# Patient Record
Sex: Female | Born: 1980 | Race: Black or African American | Hispanic: No | Marital: Single | Smoking: Never smoker
Health system: Southern US, Community
[De-identification: ages and names within clinical notes are randomized; demographics above are authoritative.]

## PROBLEM LIST (undated history)

## (undated) DIAGNOSIS — Z8661 Personal history of infections of the central nervous system: Secondary | ICD-10-CM

## (undated) DIAGNOSIS — N938 Other specified abnormal uterine and vaginal bleeding: Secondary | ICD-10-CM

## (undated) DIAGNOSIS — H9191 Unspecified hearing loss, right ear: Secondary | ICD-10-CM

## (undated) HISTORY — DX: Other specified abnormal uterine and vaginal bleeding: N93.8

---

## 2006-07-29 ENCOUNTER — Emergency Department (HOSPITAL_COMMUNITY): Admission: EM | Admit: 2006-07-29 | Discharge: 2006-07-29 | Payer: Self-pay | Admitting: *Deleted

## 2007-05-13 ENCOUNTER — Inpatient Hospital Stay (HOSPITAL_COMMUNITY): Admission: AD | Admit: 2007-05-13 | Discharge: 2007-05-13 | Payer: Self-pay | Admitting: Obstetrics & Gynecology

## 2007-05-30 ENCOUNTER — Ambulatory Visit: Payer: Self-pay | Admitting: Obstetrics and Gynecology

## 2007-06-07 ENCOUNTER — Encounter (INDEPENDENT_AMBULATORY_CARE_PROVIDER_SITE_OTHER): Payer: Self-pay | Admitting: Gynecology

## 2007-06-07 ENCOUNTER — Ambulatory Visit: Payer: Self-pay | Admitting: Gynecology

## 2007-07-18 ENCOUNTER — Ambulatory Visit: Payer: Self-pay | Admitting: Obstetrics & Gynecology

## 2007-08-01 ENCOUNTER — Ambulatory Visit: Payer: Self-pay | Admitting: Obstetrics and Gynecology

## 2007-11-29 ENCOUNTER — Ambulatory Visit: Payer: Self-pay | Admitting: *Deleted

## 2008-03-20 ENCOUNTER — Ambulatory Visit: Payer: Self-pay | Admitting: Family Medicine

## 2008-03-20 ENCOUNTER — Encounter: Payer: Self-pay | Admitting: Obstetrics and Gynecology

## 2008-09-18 ENCOUNTER — Encounter: Payer: Self-pay | Admitting: Obstetrics and Gynecology

## 2008-09-18 ENCOUNTER — Ambulatory Visit: Payer: Self-pay | Admitting: Obstetrics & Gynecology

## 2008-09-18 ENCOUNTER — Encounter (INDEPENDENT_AMBULATORY_CARE_PROVIDER_SITE_OTHER): Payer: Self-pay | Admitting: Family Medicine

## 2008-12-19 ENCOUNTER — Emergency Department (HOSPITAL_COMMUNITY): Admission: EM | Admit: 2008-12-19 | Discharge: 2008-12-19 | Payer: Self-pay | Admitting: Emergency Medicine

## 2009-03-03 ENCOUNTER — Ambulatory Visit: Payer: Self-pay | Admitting: Obstetrics & Gynecology

## 2009-03-11 ENCOUNTER — Encounter: Admission: RE | Admit: 2009-03-11 | Discharge: 2009-03-11 | Payer: Self-pay | Admitting: Obstetrics & Gynecology

## 2009-07-20 ENCOUNTER — Emergency Department (HOSPITAL_COMMUNITY): Admission: EM | Admit: 2009-07-20 | Discharge: 2009-07-20 | Payer: Self-pay | Admitting: Emergency Medicine

## 2009-09-22 ENCOUNTER — Ambulatory Visit: Payer: Self-pay | Admitting: Obstetrics and Gynecology

## 2009-09-22 ENCOUNTER — Encounter: Payer: Self-pay | Admitting: Family

## 2009-09-22 LAB — CONVERTED CEMR LAB
MCHC: 33.1 g/dL (ref 30.0–36.0)
MCV: 94.8 fL (ref 78.0–100.0)
Platelets: 308 10*3/uL (ref 150–400)
RBC: 4.03 M/uL (ref 3.87–5.11)
RDW: 13.9 % (ref 11.5–15.5)

## 2009-11-29 ENCOUNTER — Emergency Department (HOSPITAL_COMMUNITY): Admission: EM | Admit: 2009-11-29 | Discharge: 2009-11-29 | Payer: Self-pay | Admitting: Family Medicine

## 2010-05-04 ENCOUNTER — Ambulatory Visit: Payer: Self-pay | Admitting: Obstetrics and Gynecology

## 2010-09-06 ENCOUNTER — Emergency Department (HOSPITAL_COMMUNITY): Admission: EM | Admit: 2010-09-06 | Discharge: 2010-09-06 | Payer: Self-pay | Admitting: Emergency Medicine

## 2010-11-23 ENCOUNTER — Ambulatory Visit
Admission: RE | Admit: 2010-11-23 | Discharge: 2010-11-23 | Payer: Self-pay | Source: Home / Self Care | Attending: Obstetrics and Gynecology | Admitting: Obstetrics and Gynecology

## 2010-12-28 NOTE — Progress Notes (Unsigned)
NAMECISSY, GALBREATH NO.:  [id number]  MEDICAL RECORD NO.:  [id number]           PATIENT TYPE:  LOCATION:  [facility]                     FACILITY:  PHYSICIAN:  Argentina Donovan, MD        DATE OF BIRTH:  1981/06/13  DATE OF SERVICE:  11/23/2010                                 CLINIC NOTE  The patient is a 30 year old African American female, nulligravida who at her last visit weighed 100 pounds and now weighs 106, 5 feet 5 inches tall.  She came in because of menstrual irregularity sometime ago, was placed on Sprintec which helped.  However, recently she has had intermittent episodes of bleeding.  In talking to her, she tends to miss pills occasionally.  She was not aware that missing the pill could cause her to bleed, so we talked about that and how to take it.  Also talked about constipation, which has been a real problem with her since she started the pill.  I did not think it was related.  I questioned her about diet.  It sound like, she is on a low fiber diet more than anything else with fast food and we have encouraged her to increase oral supplement of her fiber and to increase her fluid intake.  I think the patient is fine but if she continues having problems, I would suggest that perhaps we choose an alternative method of contraception for this young lady.  IMPRESSION:  Intermenstrual bleeding due to missing pill and constipation.          ______________________________ Argentina Donovan, MD    PR/MEDQ  D:  11/23/2010  T:  11/24/2010  Job:  [id number]

## 2011-01-22 LAB — POCT PREGNANCY, URINE: Preg Test, Ur: NEGATIVE

## 2011-02-15 LAB — POCT PREGNANCY, URINE: Preg Test, Ur: NEGATIVE

## 2011-02-21 LAB — POCT I-STAT, CHEM 8
Calcium, Ion: 1.17 mmol/L (ref 1.12–1.32)
Creatinine, Ser: 1 mg/dL (ref 0.4–1.2)
Glucose, Bld: 104 mg/dL — ABNORMAL HIGH (ref 70–99)
HCT: 38 % (ref 36.0–46.0)
Hemoglobin: 12.9 g/dL (ref 12.0–15.0)
TCO2: 24 mmol/L (ref 0–100)

## 2011-03-21 ENCOUNTER — Emergency Department (HOSPITAL_COMMUNITY)
Admission: EM | Admit: 2011-03-21 | Discharge: 2011-03-21 | Disposition: A | Discharge status: Home / Self Care | Payer: BC Managed Care – PPO | Attending: Emergency Medicine | Admitting: Emergency Medicine

## 2011-03-21 DIAGNOSIS — IMO0001 Reserved for inherently not codable concepts without codable children: Secondary | ICD-10-CM | POA: Insufficient documentation

## 2011-03-21 DIAGNOSIS — I891 Lymphangitis: Secondary | ICD-10-CM | POA: Insufficient documentation

## 2011-03-21 NOTE — Group Therapy Note (Signed)
Stacie [name], Stacie [name]            ACCOUNT NO.:  [id number]   MEDICAL RECORD NO.:  [id number]          PATIENT TYPE:  WOC   LOCATION:  [facility]                   FACILITY:  [facility]   PHYSICIAN:  Tinnie Gens, MD        DATE OF BIRTH:  06-02-1981   DATE OF SERVICE:                                  CLINIC NOTE   REASON FOR VISIT:  Contraception followup.   INTERVAL HISTORY:  This is a 30 year old nulliparous African-American  female who is here for followup after initiation of NuvaRing 3 months  ago.  She is doing well with it and is happy with that method.  She has  the same sexual partner for 1 year and declines any STI testing.  Of  note, she is overdue for a Pap smear which was last done June 10, 2007  showing  LGSIL and HPV.  She had a colposcopy and ECC showing no evidence of  malignancy.  Plan was for a follow up Pap smear in 6 months from that.  She denies  any vaginal irritation or itching.  No dyspareunia.  No problem with  tampons.  States her blood pressure has never been elevated to her  knowledge.  She is concerned with having had an episode on February 15, 2008 where she had severe menstrual-like cramp that lasted about 15  minutes causing her to double over and felt like her vision was  disrupted.  Later that day,  she did have onset of menses and the  episode did not ever recur.  She has no neurologic symptoms except to  say that the side of her face is numb after she has been sleeping on  that side of her face for a while   OBJECTIVE:  VITAL SIGNS:  Temperature 96.7, pulse 98, BP 127/92, a blood  pressure recheck is pending, weight 95.7, height 5 feet 5 inches.  GENERAL:  She is in no distress.  NEUROLOGIC:  Grossly intact with normal gait.  ABDOMEN:  Soft, scaphoid.  GU/PELVIC:  NEFG, Tanner 5, negative BSU.  Vagina well rugated and  physiologic white discharge.  Pap done.  Cervix is nulliparous with no  lesions.  Bimanual:  Uterus anteverted, anteflexed, normal  size and  shape.  Adnexa, no tenderness or masses   ASSESSMENT:  1. Contraceptive management, doing well on NuvaRing.  2. Undernutrition.  3. Isolated borderline blood pressure.  Blood pressure recheck is      120/76.   DISCHARGE INSTRUCTIONS:  She is advised to continue on the NuvaRing as  directed and followup will be pending result of this Pap smear.  She is  also advised that if she has another episode like she had last month,  she  should go to an ER if it does not resolve on its own in a brief amount  of time.  Discussed diet and her plans that she may be moving in the  near future.  If so, we will fax records as needed.     ______________________________  Caren Griffins, CNM    ______________________________  Tinnie Gens, MD    DP/MEDQ  D:  03/20/2008  T:  03/20/2008  Job:  [id number]

## 2011-03-21 NOTE — Group Therapy Note (Signed)
NAMEMADHAVI, HAMBLEN NO.:  [id number]   MEDICAL RECORD NO.:  [id number]          PATIENT TYPE:  WOC   LOCATION:  [facility]                   FACILITY:  [facility]   PHYSICIAN:  Elsie Lincoln, MD      DATE OF BIRTH:  1981/01/27   DATE OF SERVICE:                                  CLINIC NOTE   HISTORY OF PRESENT ILLNESS:  The patient is a 30 year old female who  presents for bleeding almost every day since March 11.  This is the  second episode that this happened to the patient.  It also happened in  2007.  In the past, the patient had been on NuvaRing but stopped  secondary to insurance issues a while back.  The patient began her  menses March 11, it was heavy for three weeks, and then she has been  spotting on and off since ending the three weeks continuously.  The  patient denies being sexually active and her UCG today is negative.  There is no cramping associated with it, and there is also no breast  tenderness associated with her bleeding.  The biggest impression I have  at this point is that the patient has a DMI of 16, and this is probably  very hypoestrogenic and not ovulating and having an ovulatory bleeding  problem, but hypoestrogenic.   PAST MEDICAL HISTORY:  Meningitis and hearing impaired.   PAST SURGICAL HISTORY:  Denies.   SOCIAL HISTORY:  Nonsmoker, nondrinker, no drug use.  Joann had a low-  grade Pap smear in the past and then two negative Pap smears after that,  colpo was unremarkable.  She is not sexually active currently.   MEDICATIONS:  Claritin.   ALLERGIES:  No known drug allergies.  No Latex allergies.   PHYSICAL EXAMINATION:  VITAL SIGNS:  Temperature 97.4, pulse 70, blood  pressure 106/69, weight 96.5, height 5 feet 5 inches, BMI 16.  GENERAL:  Underweight.  No apparent distress.  HEENT:  Normocephalic, atraumatic.  ABDOMEN:  Soft, nontender.  No organomegaly.  No hernia.  Genitalia  Tanner V.  Vaginal pink, normal rugae.  Old dark  blood in the vault.  No  clots.  Cervix closed, nontender.  Uterus very small.  Adnexa:  No  masses, nontender.   ASSESSMENT/PLAN:  A 30 year old female with abnormal uterine bleeding  most likely secondary to her low body weight.  1. Check TSH.  2. Start patient on [place].  The patient has headache on birth      control pills, possibly migraines.  There is no aura.  When the      patient only has four periods a year, hopefully this will decrease      her migraines.  If it becomes a problem, we can send her to      __________[facility].  3. The patient will be sent to nutrition for counseling about how to      gain weight.  I think if she gains some weight      and starts ovulating, her bleeding will not be a problem.  4. Return to clinic in six weeks.  ______________________________  Elsie Lincoln, MD     KL/MEDQ  D:  03/03/2009  T:  03/03/2009  Job:  [id number]

## 2011-03-21 NOTE — Group Therapy Note (Signed)
Stacie [name], MERCED NO.:  [id number]   MEDICAL RECORD NO.:  [id number]          PATIENT TYPE:  WOC   LOCATION:  [facility]                   FACILITY:  [facility]   PHYSICIAN:  Ginger Carne, MD DATE OF BIRTH:  Jan 18, 1981   DATE OF SERVICE:  06/07/2007                                  CLINIC NOTE   This is a 30 year old African American female who presents for her  yearly examination.  Apparently, she was seen in the [facility] on May 13, 2007, complaining of vulvar and vaginal irritation.  Since then, the  patient seems to have less discomfort and the impression was contact  dermatitis.  Otherwise, her menses are regular about every 30 days  lasting 4-5 days.  Is not sexually active and has no desire for birth  control.  This is her first gynecological examination.   PHYSICAL EXAMINATION:  Vital signs per record sheet.  HEENT:  Grossly normal.  BREAST:  Exam without masses, discharge, thickenings or tenderness.  CHEST:  Clear to percussion and auscultation.  CARDIOVASCULAR:  Without murmurs or enlargements.  Regular rate and  rhythm.  EXTREMITIES, LYMPHATICS, SKIN, NEUROLOGICAL, MUSCULOSKELETAL SYSTEMS:  Normal.  ABDOMEN:  Soft without gross hepatosplenomegaly.  PELVIC EXAM:  External genitalia, vulva and vagina reveal some degree of  scaling on the vulva and perianal regions. No other specific findings  noted.  The cervix is smooth without erosions or lesion.  Uterus is  small, inverted and flexed and both adnexa normal.  Pap smear performed.   IMPRESSION:  Normal gynecological exam with resolving dermatitis.  The  patient states she has slight irritation below consisting of purists and  Balmex was recommended to use twice a day until clear.           ______________________________  Ginger Carne, MD     SHB/MEDQ  D:  06/07/2007  T:  06/07/2007  Job:  [id number]

## 2011-03-21 NOTE — Group Therapy Note (Signed)
Stacie [name], Stacie [name] NO.:  [id number]   MEDICAL RECORD NO.:  [id number]          PATIENT TYPE:  WOC   LOCATION:  [facility]                   FACILITY:  [facility]   PHYSICIAN:  Karlton Lemon, MD      DATE OF BIRTH:  05-23-1981   DATE OF SERVICE:                                  CLINIC NOTE   REASON FOR VISIT:  Contraception management.   HISTORY OF PRESENT ILLNESS:  This is a 30 year old gravida 0 that is  presenting for contraceptive management.  She wants to discuss her  options for contraception and initiate birth control today.  She says  she has not been sexually active in the past, but desires to begin  sexual activity with her current partner.  She does state she has a  preference to start a vaginal ring, but would like to hear about the  other options.   PAST MEDICAL HISTORY:  History of LGSIL.   OBSTETRICAL HISTORY:  The patient has never been pregnant.   MEDICATIONS:  None.   ALLERGIES:  NO KNOWN DRUG ALLERGIES.   SOCIAL HISTORY:  Denies alcohol, tobacco or drug use.   PHYSICAL EXAMINATION:  GENERAL:  This is a well-appearing female in no  distress.  VITAL SIGNS:  Temperature 97.0. pulse 97, blood pressure 118/78, weight  100.9 pounds.   ASSESSMENT/PLAN:  This is a 30 year old gravida 0, presenting for  contraceptive management.  I have discussed all options including oral  contraceptive pills, estrogen patches, estrogen ring, Depo-Provera,  Implanon and intrauterine devices.  The patient elects to try the  NuvaRing vaginal ring for contraception.  We will provide her with  prescription for 1 ring per month with refills for 1 year.  We will  follow her in 3-4 months to follow her satisfaction with this type of  contraception.  The patient is instructed that if she does not like the  NuvaRing, she can call and ask for an appointment at a sooner date.           ______________________________  Karlton Lemon, MD     NS/MEDQ  D:  11/29/2007   T:  11/29/2007  Job:  [id number]

## 2011-04-20 ENCOUNTER — Ambulatory Visit: Payer: BC Managed Care – PPO | Admitting: Physician Assistant

## 2011-08-08 LAB — POCT URINALYSIS DIP (DEVICE)
Nitrite: NEGATIVE
Protein, ur: NEGATIVE
Specific Gravity, Urine: 1.02
Urobilinogen, UA: 0.2
pH: 7

## 2011-08-22 LAB — GC/CHLAMYDIA PROBE AMP, GENITAL
Chlamydia, DNA Probe: NEGATIVE
GC Probe Amp, Genital: NEGATIVE

## 2011-08-22 LAB — WET PREP, GENITAL

## 2011-12-11 ENCOUNTER — Emergency Department (INDEPENDENT_AMBULATORY_CARE_PROVIDER_SITE_OTHER): Payer: BC Managed Care – PPO

## 2011-12-11 ENCOUNTER — Emergency Department (INDEPENDENT_AMBULATORY_CARE_PROVIDER_SITE_OTHER)
Admission: EM | Admit: 2011-12-11 | Discharge: 2011-12-11 | Disposition: A | Discharge status: Home / Self Care | Payer: BC Managed Care – PPO | Source: Home / Self Care | Attending: Family Medicine | Admitting: Family Medicine

## 2011-12-11 ENCOUNTER — Encounter (HOSPITAL_COMMUNITY): Payer: Self-pay

## 2011-12-11 DIAGNOSIS — M546 Pain in thoracic spine: Secondary | ICD-10-CM

## 2011-12-11 DIAGNOSIS — H9319 Tinnitus, unspecified ear: Secondary | ICD-10-CM

## 2011-12-11 MED ORDER — DICLOFENAC POTASSIUM 50 MG PO TABS: 50.0000 mg | ORAL_TABLET | Freq: Three times a day (TID) | ORAL | Status: DC

## 2011-12-11 NOTE — ED Notes (Signed)
C/o sharp pain in lt arm, muscle aches in lt upper back and ringing in ears for 1 week.

## 2011-12-11 NOTE — ED Provider Notes (Signed)
History     CSN: [id number]  Arrival date & time 12/11/11  1249   First MD Initiated Contact with Patient 12/11/11 1410      Chief Complaint  Patient presents with  . Arm Pain  . Back Pain  . Tinnitus    (Consider location/radiation/quality/duration/timing/severity/associated sxs/prior treatment) Patient is a 31 y.o. female presenting with arm pain and back pain. The history is provided by the patient.  Arm Pain This is a new problem. The current episode started more than 1 week ago (rash left dorsal forearm , resolved, then pain skipping upper arm and also in left mid thoracic back. ). The problem occurs constantly. The problem has not changed since onset.Pertinent negatives include no chest pain, no abdominal pain and no shortness of breath.  Back Pain  Pertinent negatives include no chest pain and no abdominal pain.    History reviewed. No pertinent past medical history.  History reviewed. No pertinent past surgical history.  No family history on file.  History  Substance Use Topics  . Smoking status: Never Smoker   . Smokeless tobacco: Not on file  . Alcohol Use: Yes    OB History    Grav Para Term Preterm Abortions TAB SAB Ect Mult Living                  Review of Systems  Constitutional: Negative.   HENT: Positive for tinnitus. Negative for congestion, rhinorrhea and postnasal drip.   Eyes: Negative.   Respiratory: Negative.  Negative for shortness of breath.   Cardiovascular: Negative for chest pain.  Gastrointestinal: Negative.  Negative for abdominal pain.  Musculoskeletal: Positive for back pain.  Skin: Negative.   Neurological: Negative.     Allergies  Review of patient's allergies indicates no known allergies.  Home Medications   Current Outpatient Rx  Name Route Sig Dispense Refill  . NORGESTIMATE-ETH ESTRADIOL 0.25-35 MG-MCG PO TABS Oral Take 1 tablet by mouth daily.    Marland Kitchen DICLOFENAC POTASSIUM 50 MG PO TABS Oral Take 1 tablet (50 mg total)  by mouth 3 (three) times daily. 30 tablet 0    BP 105/68  Pulse 89  Temp(Src) 97.9 F (36.6 C) (Oral)  Resp 20  SpO2 99%  LMP 12/04/2011  Physical Exam  Nursing note and vitals reviewed. Constitutional: She appears well-developed and well-nourished.  HENT:  Head: Normocephalic.  Right Ear: External ear normal. No drainage, swelling or tenderness. Tympanic membrane is not scarred and not perforated. No middle ear effusion. Decreased hearing is noted.  Left Ear: External ear normal. No drainage, swelling or tenderness. Tympanic membrane is not scarred and not perforated.  No middle ear effusion. Decreased hearing is noted.  Mouth/Throat: Oropharynx is clear and moist.  Eyes: Pupils are equal, round, and reactive to light.  Neck: Normal range of motion. Neck supple.  Cardiovascular: Normal rate.   Pulmonary/Chest: Effort normal and breath sounds normal.  Musculoskeletal: Normal range of motion. She exhibits no tenderness.       Thoracic back: Normal.       Left forearm: Normal. She exhibits no tenderness, no swelling, no edema and no deformity.  Lymphadenopathy:    She has no cervical adenopathy.    ED Course  Procedures (including critical care time)  Labs Reviewed - No data to display Dg Thoracic Spine 2 View  12/11/2011  *RADIOLOGY REPORT*  Clinical Data: Back pain.  THORACIC SPINE - 2 VIEW  Comparison: None  Findings: The lateral film demonstrates normal alignment  of the thoracic vertebral bodies.  Disc spaces and vertebral bodies are maintained.  No acute bony findings, destructive bony changes or abnormal paraspinal soft tissue swelling.  The visualized posterior ribs appear normal.  IMPRESSION: Normal alignment and no acute bony findings.  Original Report Authenticated By: P. Loralie Champagne, M.D.     1. Back pain, thoracic   2. Tinnitus, subjective       MDM  X-rays reviewed and report per radiologist.         Barkley Bruns, MD 12/11/11 [phone]

## 2011-12-14 ENCOUNTER — Ambulatory Visit: Payer: BC Managed Care – PPO | Admitting: Family Medicine

## 2012-01-09 ENCOUNTER — Other Ambulatory Visit: Payer: Self-pay | Admitting: Otolaryngology

## 2012-01-15 ENCOUNTER — Ambulatory Visit
Admission: RE | Admit: 2012-01-15 | Discharge: 2012-01-15 | Disposition: A | Discharge status: Home / Self Care | Payer: BC Managed Care – PPO | Source: Ambulatory Visit | Attending: Otolaryngology | Admitting: Otolaryngology

## 2012-01-15 MED ORDER — GADOBENATE DIMEGLUMINE 529 MG/ML IV SOLN
11.0000 mL | Freq: Once | INTRAVENOUS | Status: AC | PRN
  Administered 2012-01-15: 11 mL via INTRAVENOUS

## 2012-01-17 ENCOUNTER — Ambulatory Visit: Payer: BC Managed Care – PPO | Admitting: Family Medicine

## 2012-02-07 ENCOUNTER — Encounter: Payer: Self-pay | Admitting: Physician Assistant

## 2012-02-07 ENCOUNTER — Ambulatory Visit (INDEPENDENT_AMBULATORY_CARE_PROVIDER_SITE_OTHER): Payer: BC Managed Care – PPO | Admitting: Physician Assistant

## 2012-02-07 VITALS — BP 102/68 | HR 72 | Temp 97.8°F | Resp 16 | Ht 65.0 in | Wt 94.7 lb

## 2012-02-07 DIAGNOSIS — N912 Amenorrhea, unspecified: Secondary | ICD-10-CM

## 2012-02-07 DIAGNOSIS — N938 Other specified abnormal uterine and vaginal bleeding: Secondary | ICD-10-CM

## 2012-02-07 DIAGNOSIS — Z113 Encounter for screening for infections with a predominantly sexual mode of transmission: Secondary | ICD-10-CM

## 2012-02-07 HISTORY — DX: Other specified abnormal uterine and vaginal bleeding: N93.8

## 2012-02-07 LAB — POCT PREGNANCY, URINE: Preg Test, Ur: NEGATIVE

## 2012-02-07 NOTE — Progress Notes (Signed)
Chief Complaint:  Menorrhagia    None     Stacie [name] is  31 y.o. G0P0.  Patient's last menstrual period was 12/31/2011.Marland Kitchen  Her pregnancy status is negative.  She presents complaining of Menorrhagia  Pt states she has had irregular bleeding x 2-3 months since stopping OCPs. Reports that pharmacist at [facility] instructed her to stop while on ABX for an ear infection. Pt states that she was originally put of OCPs   Past Medical History: Past Medical History  Diagnosis Date  . DUB (dysfunctional uterine bleeding) 02/07/2012    Past Surgical History: History reviewed. No pertinent past surgical history.  Family History: Family History  Problem Relation Age of Onset  . Ovarian cysts Mother   . Ovarian cysts Sister     Social History: History  Substance Use Topics  . Smoking status: Never Smoker   . Smokeless tobacco: Not on file  . Alcohol Use: Yes     occasionally    Allergies: No Known Allergies   (Not in a hospital admission)  Review of Systems - Negative except what is in HPI  Physical Exam   Blood pressure 102/68, pulse 72, temperature 97.8 F (36.6 C), temperature source Oral, resp. rate 16, height 5\' 5"  (1.651 m), weight 94 lb 11.2 oz (42.956 kg), last menstrual period 12/31/2011. General: Alert, well appearing, and in no distress Focused Gynecological Exam: normal external genitalia, vulva, vagina, cervix, uterus and adnexa. Cultures obtained.  Assessment: Patient Active Problem List  Diagnoses  . DUB (dysfunctional uterine bleeding)    Plan: Bleeding due to stopping OCPs STI testing done as per patient request She will restart OCPs.  Stacie [name] E. 02/07/2012,2:26 PM

## 2012-02-07 NOTE — Progress Notes (Signed)
Pt has not taken OCP's for 1-2 months- she states she was told by the [facility] pharmacist that she could not take OCP's while on antibiotics. She had been given antibiotic by Dr. Joneen Roach for ear fluid imbalance.

## 2012-02-07 NOTE — Patient Instructions (Signed)

## 2012-02-08 LAB — WET PREP, GENITAL
Trich, Wet Prep: NONE SEEN
Yeast Wet Prep HPF POC: NONE SEEN

## 2012-07-22 ENCOUNTER — Ambulatory Visit: Payer: BC Managed Care – PPO | Admitting: Obstetrics & Gynecology

## 2012-08-19 ENCOUNTER — Ambulatory Visit: Payer: BC Managed Care – PPO | Admitting: Obstetrics & Gynecology

## 2012-09-20 ENCOUNTER — Emergency Department (HOSPITAL_COMMUNITY)
Admission: EM | Admit: 2012-09-20 | Discharge: 2012-09-20 | Disposition: A | Discharge status: Home / Self Care | Payer: BC Managed Care – PPO | Attending: Emergency Medicine | Admitting: Emergency Medicine

## 2012-09-20 ENCOUNTER — Encounter (HOSPITAL_COMMUNITY): Payer: Self-pay | Admitting: *Deleted

## 2012-09-20 DIAGNOSIS — M549 Dorsalgia, unspecified: Secondary | ICD-10-CM | POA: Insufficient documentation

## 2012-09-20 DIAGNOSIS — S39012A Strain of muscle, fascia and tendon of lower back, initial encounter: Secondary | ICD-10-CM

## 2012-09-20 DIAGNOSIS — Y939 Activity, unspecified: Secondary | ICD-10-CM | POA: Insufficient documentation

## 2012-09-20 DIAGNOSIS — Z8742 Personal history of other diseases of the female genital tract: Secondary | ICD-10-CM | POA: Insufficient documentation

## 2012-09-20 DIAGNOSIS — Y92410 Unspecified street and highway as the place of occurrence of the external cause: Secondary | ICD-10-CM | POA: Insufficient documentation

## 2012-09-20 DIAGNOSIS — S335XXA Sprain of ligaments of lumbar spine, initial encounter: Secondary | ICD-10-CM | POA: Insufficient documentation

## 2012-09-20 MED ORDER — CYCLOBENZAPRINE HCL 10 MG PO TABS: 10.0000 mg | ORAL_TABLET | Freq: Two times a day (BID) | ORAL | Status: AC | PRN

## 2012-09-20 MED ORDER — IBUPROFEN 600 MG PO TABS: 600.0000 mg | ORAL_TABLET | Freq: Four times a day (QID) | ORAL | Status: AC | PRN
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
## 2012-09-20 NOTE — ED Notes (Signed)
C-collar removed by EDPA.  

## 2012-09-23 ENCOUNTER — Ambulatory Visit (INDEPENDENT_AMBULATORY_CARE_PROVIDER_SITE_OTHER): Payer: BC Managed Care – PPO | Admitting: Obstetrics & Gynecology

## 2012-09-23 ENCOUNTER — Encounter: Payer: Self-pay | Admitting: Obstetrics & Gynecology

## 2012-09-23 VITALS — BP 119/77 | HR 90 | Temp 97.5°F | Ht 68.0 in | Wt 101.2 lb

## 2012-09-23 DIAGNOSIS — Z01419 Encounter for gynecological examination (general) (routine) without abnormal findings: Secondary | ICD-10-CM

## 2012-09-23 DIAGNOSIS — Z0000 Encounter for general adult medical examination without abnormal findings: Secondary | ICD-10-CM

## 2012-09-23 MED ORDER — NORETHIN-ETH ESTRAD BIPHASIC 0.5-35/1-35 MG-MCG PO TABS: 1.0000 | ORAL_TABLET | Freq: Every day | ORAL | Status: DC

## 2012-09-23 NOTE — Progress Notes (Signed)
Subjective:    Stacie [name] is a 31 y.o. female who presents for an annual exam. The patient has no complaints today. She would like to restart OCPs although her periods have been much lighter. The patient is sexually active. GYN screening history: last pap: was normal. The patient wears seatbelts: yes. The patient participates in regular exercise: yes. Has the patient ever been transfused or tattooed?: not asked. The patient reports that there is not domestic violence in her life.   Menstrual History: OB History    Grav Para Term Preterm Abortions TAB SAB Ect Mult Living   0               Menarche age: 23 Patient's last menstrual period was 07/21/2012.    The following portions of the patient's history were reviewed and updated as appropriate: allergies, current medications, past family history, past medical history, past social history, past surgical history and problem list.  Review of Systems A comprehensive review of systems was negative.    Objective:    BP 119/77  Pulse 90  Temp 97.5 F (36.4 C) (Oral)  Ht 5\' 8"  (1.727 m)  Wt 45.904 kg (101 lb 3.2 oz)  BMI 15.39 kg/m2  LMP 07/21/2012  General Appearance:    Alert, cooperative, no distress, appears stated age  Head:    Normocephalic, without obvious abnormality, atraumatic  Eyes:    PERRL, conjunctiva/corneas clear, EOM's intact, fundi    benign, both eyes  Ears:    Normal TM's and external ear canals, both ears  Nose:   Nares normal, septum midline, mucosa normal, no drainage    or sinus tenderness  Throat:   Lips, mucosa, and tongue normal; teeth and gums normal  Neck:   Supple, symmetrical, trachea midline, no adenopathy;    thyroid:  no enlargement/tenderness/nodules; no carotid   bruit or JVD  Back:     Symmetric, no curvature, ROM normal, no CVA tenderness  Lungs:     Clear to auscultation bilaterally, respirations unlabored  Chest Wall:    No tenderness or deformity   Heart:    Regular rate and rhythm, S1  and S2 normal, no murmur, rub   or gallop  Breast Exam:    No tenderness, masses, or nipple abnormality  Abdomen:     Soft, non-tender, bowel sounds active all four quadrants,    no masses, no organomegaly  Genitalia:    Normal female without lesion, discharge or tenderness, NSSA, NT, mobile, no adnexal masses or tenderness     Extremities:   Extremities normal, atraumatic, no cyanosis or edema  Pulses:   2+ and symmetric all extremities  Skin:   Skin color, texture, turgor normal, no rashes or lesions  Lymph nodes:   Cervical, supraclavicular, and axillary nodes normal  Neurologic:   CNII-XII intact, normal strength, sensation and reflexes    throughout  .    Assessment:    Healthy female exam.    Plan:     Thin prep Pap smear.  Restart OCPs BP check in a month She declines a flu vaccine

## 2012-09-24 NOTE — ED Provider Notes (Signed)
Medical screening examination/treatment/procedure(s) were performed by non-physician practitioner and as supervising physician I was immediately available for consultation/collaboration.   Dione Booze, MD 09/24/12 [phone]

## 2012-12-09 ENCOUNTER — Encounter: Payer: Self-pay | Admitting: Physician Assistant

## 2013-04-29 ENCOUNTER — Emergency Department (INDEPENDENT_AMBULATORY_CARE_PROVIDER_SITE_OTHER)
Admission: EM | Admit: 2013-04-29 | Discharge: 2013-04-29 | Disposition: A | Discharge status: Home / Self Care | Payer: BC Managed Care – PPO | Source: Home / Self Care

## 2013-04-29 ENCOUNTER — Encounter (HOSPITAL_COMMUNITY): Payer: Self-pay | Admitting: Emergency Medicine

## 2013-04-29 DIAGNOSIS — H9312 Tinnitus, left ear: Secondary | ICD-10-CM

## 2013-04-29 DIAGNOSIS — H9319 Tinnitus, unspecified ear: Secondary | ICD-10-CM

## 2013-04-29 DIAGNOSIS — J302 Other seasonal allergic rhinitis: Secondary | ICD-10-CM

## 2013-04-29 DIAGNOSIS — J309 Allergic rhinitis, unspecified: Secondary | ICD-10-CM

## 2013-04-29 HISTORY — DX: Unspecified hearing loss, right ear: H91.91

## 2013-04-29 MED ORDER — MECLIZINE HCL 25 MG PO TABS: ORAL_TABLET | ORAL | Status: AC

## 2013-04-29 MED ORDER — CHLORPHENIRAMINE-PSE-IBUPROFEN 2-30-200 MG PO TABS: ORAL_TABLET | ORAL | Status: AC

## 2013-04-29 MED ORDER — METHYLPREDNISOLONE 4 MG PO KIT: PACK | ORAL | Status: AC

## 2013-04-29 MED ORDER — MOMETASONE FUROATE 50 MCG/ACT NA SUSP
NASAL | Status: AC
Start: 2013-04-29 — End: ?

## 2013-04-29 NOTE — ED Notes (Signed)
Pt c/o left ear pain x 1 month. Feels like it could be allergy related. Took some allergy medicine with no relief. Patient is alert and oriented.

## 2013-04-29 NOTE — ED Provider Notes (Signed)
History    CSN: [id number] Arrival date & time 04/29/13  1357  None    Chief Complaint  Patient presents with  . Otalgia   (Consider location/radiation/quality/duration/timing/severity/associated sxs/prior Treatment) HPI Comments: 32 year old female presents complaining of tinnitus intermittently for 3 weeks, becoming constant for the past week in the left ear only and also some intermittent mild vertigo-type symptoms. She normally has some tinnitus with her usual seasonal allergies but has never been constant as it has been for the past week. The vertigo is brief and is associated with change in head position. She denies fever, chills, headache, blurry vision.  Patient is a 32 y.o. female presenting with ear pain.  Otalgia Associated symptoms: congestion, hearing loss (deaf in right ear, decreased hearing in left ear), rhinorrhea and tinnitus   Associated symptoms: no abdominal pain, no cough, no ear discharge, no fever, no neck pain, no rash, no sore throat and no vomiting    Past Medical History  Diagnosis Date  . DUB (dysfunctional uterine bleeding) 02/07/2012  . Deafness in right ear    History reviewed. No pertinent past surgical history. Family History  Problem Relation Age of Onset  . Ovarian cysts Mother   . Ovarian cysts Sister    History  Substance Use Topics  . Smoking status: Never Smoker   . Smokeless tobacco: Not on file  . Alcohol Use: Yes     Comment: occasionally   OB History   Grav Para Term Preterm Abortions TAB SAB Ect Mult Living   0              Review of Systems  Constitutional: Negative for fever and chills.  HENT: Positive for hearing loss (deaf in right ear, decreased hearing in left ear), congestion, rhinorrhea, sneezing, postnasal drip and tinnitus. Negative for ear pain, sore throat, trouble swallowing, neck pain, sinus pressure and ear discharge.   Eyes: Negative for visual disturbance.  Respiratory: Negative for cough and shortness of  breath.   Cardiovascular: Negative for chest pain, palpitations and leg swelling.  Gastrointestinal: Negative for nausea, vomiting and abdominal pain.  Endocrine: Negative for polydipsia and polyuria.  Genitourinary: Negative for dysuria, urgency and frequency.  Musculoskeletal: Negative for myalgias and arthralgias.  Skin: Negative for rash.  Neurological: Positive for dizziness (vertigo). Negative for weakness and light-headedness.    Allergies  Review of patient's allergies indicates no known allergies.  Home Medications   Current Outpatient Rx  Name  Route  Sig  Dispense  Refill  . Chlorpheniramine-PSE-Ibuprofen (ADVIL ALLERGY SINUS) 2-30-200 MG TABS      2 tabs by mouth every 6 hours when necessary   84 each   0   . cyclobenzaprine (FLEXERIL) 10 MG tablet   Oral   Take 1 tablet (10 mg total) by mouth 2 (two) times daily as needed for muscle spasms.   20 tablet   0   . ibuprofen (ADVIL,MOTRIN) 600 MG tablet   Oral   Take 1 tablet (600 mg total) by mouth every 6 (six) hours as needed for pain.   30 tablet   0   . meclizine (ANTIVERT) 25 MG tablet      1 tablet by mouth 4 times a day when necessary for vertigo   28 tablet   0   . methylPREDNISolone (MEDROL DOSEPAK) 4 MG tablet      Use taper dose pack as directed   21 tablet   0   . mometasone (NASONEX) 50 MCG/ACT nasal  spray      2 sprays per nostril twice a day   17 g   12   . Norethindrone-Ethinyl Estradiol Biphasic (NECON 10/11, 28,) 0.5-35/1-35 MG-MCG tablet   Oral   Take 1 tablet by mouth daily.   1 Package   11    BP 116/74  Pulse 69  Temp(Src) 97.6 F (36.4 C) (Oral)  Resp 16  SpO2 99%  LMP 03/12/2013 Physical Exam  Nursing note and vitals reviewed. Constitutional: She is oriented to person, place, and time. Vital signs are normal. She appears well-developed and well-nourished. No distress.  HENT:  Head: Atraumatic.  Right Ear: Tympanic membrane and ear canal normal.  Left Ear:  Tympanic membrane and ear canal normal.  Mouth/Throat: Uvula is midline, oropharynx is clear and moist and mucous membranes are normal. No oropharyngeal exudate, posterior oropharyngeal edema or posterior oropharyngeal erythema.  Right ear, hearing aid in the left ear  Eyes: EOM are normal. Pupils are equal, round, and reactive to light.  Cardiovascular: Normal rate, regular rhythm and normal heart sounds.  Exam reveals no gallop and no friction rub.   No murmur heard. Pulmonary/Chest: Effort normal and breath sounds normal. No respiratory distress. She has no wheezes. She has no rales.  Abdominal: Soft. There is no tenderness.  Neurological: She is alert and oriented to person, place, and time. She has normal strength.  Skin: Skin is warm and dry. She is not diaphoretic.  Psychiatric: She has a normal mood and affect. Her behavior is normal. Judgment normal.    ED Course  Procedures (including critical care time) Labs Reviewed - No data to display No results found. 1. Tinnitus, left   2. Seasonal allergies     MDM  The physical exam is normal today. We'll treat symptomatically for seasonal allergies and resulting vertigo and she will followup if she does not improve.    Meds ordered this encounter  Medications  . methylPREDNISolone (MEDROL DOSEPAK) 4 MG tablet    Sig: Use taper dose pack as directed    Dispense:  21 tablet    Refill:  0  . mometasone (NASONEX) 50 MCG/ACT nasal spray    Sig: 2 sprays per nostril twice a day    Dispense:  17 g    Refill:  12  . meclizine (ANTIVERT) 25 MG tablet    Sig: 1 tablet by mouth 4 times a day when necessary for vertigo    Dispense:  28 tablet    Refill:  0  . Chlorpheniramine-PSE-Ibuprofen (ADVIL ALLERGY SINUS) 2-30-200 MG TABS    Sig: 2 tabs by mouth every 6 hours when necessary    Dispense:  84 each    Refill:  0     Graylon Good, PA-C 04/29/13 2121

## 2013-05-13 NOTE — ED Provider Notes (Signed)
Medical screening examination/treatment/procedure(s) were performed by resident physician or non-physician practitioner and as supervising physician I was immediately available for consultation/collaboration.   Amariss Detamore DOUGLAS MD.   Romeka Scifres D Shad Ledvina, MD 05/13/13 0852 

## 2013-05-27 ENCOUNTER — Encounter (HOSPITAL_COMMUNITY): Payer: Self-pay | Admitting: Emergency Medicine

## 2013-05-27 ENCOUNTER — Emergency Department (HOSPITAL_COMMUNITY)
Admission: EM | Admit: 2013-05-27 | Discharge: 2013-05-27 | Disposition: A | Discharge status: Home / Self Care | Payer: BC Managed Care – PPO | Source: Home / Self Care | Attending: Family Medicine | Admitting: Family Medicine

## 2013-05-27 DIAGNOSIS — J309 Allergic rhinitis, unspecified: Secondary | ICD-10-CM

## 2013-05-27 DIAGNOSIS — H9319 Tinnitus, unspecified ear: Secondary | ICD-10-CM

## 2013-05-27 DIAGNOSIS — J310 Chronic rhinitis: Secondary | ICD-10-CM

## 2013-05-27 DIAGNOSIS — R5383 Other fatigue: Secondary | ICD-10-CM

## 2013-05-27 HISTORY — DX: Personal history of infections of the central nervous system: Z86.61

## 2013-05-27 LAB — COMPREHENSIVE METABOLIC PANEL
AST: 18 U/L (ref 0–37)
Albumin: 3.6 g/dL (ref 3.5–5.2)
Calcium: 9.3 mg/dL (ref 8.4–10.5)
Chloride: 105 mEq/L (ref 96–112)
Creatinine, Ser: 0.72 mg/dL (ref 0.50–1.10)
Total Protein: 7.5 g/dL (ref 6.0–8.3)

## 2013-05-27 LAB — CBC WITH DIFFERENTIAL/PLATELET
Basophils Absolute: 0 10*3/uL (ref 0.0–0.1)
Basophils Relative: 0 % (ref 0–1)
Eosinophils Absolute: 0.1 10*3/uL (ref 0.0–0.7)
Eosinophils Relative: 2 % (ref 0–5)
HCT: 39.9 % (ref 36.0–46.0)
MCH: 31.6 pg (ref 26.0–34.0)
MCHC: 33.3 g/dL (ref 30.0–36.0)
Monocytes Absolute: 0.4 10*3/uL (ref 0.1–1.0)
Neutro Abs: 4.2 10*3/uL (ref 1.7–7.7)
RDW: 12.8 % (ref 11.5–15.5)

## 2013-05-27 MED ORDER — FLUTICASONE PROPIONATE 50 MCG/ACT NA SUSP: 2.0000 | Freq: Every day | NASAL | Status: AC

## 2013-05-27 MED ORDER — PREDNISONE 10 MG PO KIT: PACK | ORAL | Status: AC

## 2013-05-27 NOTE — ED Provider Notes (Signed)
History    CSN: [id number] Arrival date & time 05/27/13  1716  First MD Initiated Contact with Patient 05/27/13 1823     Chief Complaint  Patient presents with  . Fatigue   (Consider location/radiation/quality/duration/timing/severity/associated sxs/prior Treatment) HPI Comments: 32 year old female presents complaining of headaches, fatigue, intermittent dizziness, decreased appetite, and ringing in her ears. She was seen here on June 24, about one month ago, for similar symptoms. At that time, she was prescribed prednisone, Nasonex, and Advil allergy sinus. She fill the prednisone but did not take anything else because she says it was too expensive. She did get significantly better with the prednisone but over the last week she has started to feel sick again. She notes that she has a large amount of black mold growing in her house to do a long history of leaky pipes and she feels that this may be contributing to her feeling ill. She states that all of her symptoms are mostly mild to moderate and annoying. She has no specific areas of pain, cough, shortness of breath, rash, abdominal pain, nausea, vomiting, diarrhea. There are no exacerbating or alleviating factors with her  symptoms. None of her symptoms happen at the same time   Past Medical History  Diagnosis Date  . DUB (dysfunctional uterine bleeding) 02/07/2012  . Deafness in right ear   . Hx of meningitis    History reviewed. No pertinent past surgical history. Family History  Problem Relation Age of Onset  . Ovarian cysts Mother   . Ovarian cysts Sister    History  Substance Use Topics  . Smoking status: Never Smoker   . Smokeless tobacco: Not on file  . Alcohol Use: Yes     Comment: occasionally   OB History   Grav Para Term Preterm Abortions TAB SAB Ect Mult Living   0              Review of Systems  Constitutional: Positive for appetite change and fatigue. Negative for fever, chills and unexpected weight change.   HENT: Positive for hearing loss (deaf in right ear, decreased hearing in left ear), congestion, rhinorrhea, sneezing, postnasal drip and tinnitus. Negative for ear pain, sore throat, trouble swallowing, neck pain, sinus pressure and ear discharge.   Eyes: Negative for visual disturbance.  Respiratory: Negative for cough and shortness of breath.   Cardiovascular: Negative for chest pain, palpitations and leg swelling.  Gastrointestinal: Negative for nausea, vomiting and abdominal pain.  Endocrine: Negative for polydipsia and polyuria.  Genitourinary: Negative for dysuria, urgency and frequency.  Musculoskeletal: Negative for myalgias and arthralgias.  Skin: Negative for rash.  Neurological: Positive for dizziness. Negative for weakness and light-headedness.    Allergies  Review of patient's allergies indicates no known allergies.  Home Medications   Current Outpatient Rx  Name  Route  Sig  Dispense  Refill  . Chlorpheniramine-PSE-Ibuprofen (ADVIL ALLERGY SINUS) 2-30-200 MG TABS      2 tabs by mouth every 6 hours when necessary   84 each   0   . cyclobenzaprine (FLEXERIL) 10 MG tablet   Oral   Take 1 tablet (10 mg total) by mouth 2 (two) times daily as needed for muscle spasms.   20 tablet   0   . fluticasone (FLONASE) 50 MCG/ACT nasal spray   Nasal   Place 2 sprays into the nose daily.   16 g   2   . ibuprofen (ADVIL,MOTRIN) 600 MG tablet   Oral   Take  1 tablet (600 mg total) by mouth every 6 (six) hours as needed for pain.   30 tablet   0   . meclizine (ANTIVERT) 25 MG tablet      1 tablet by mouth 4 times a day when necessary for vertigo   28 tablet   0   . methylPREDNISolone (MEDROL DOSEPAK) 4 MG tablet      Use taper dose pack as directed   21 tablet   0   . mometasone (NASONEX) 50 MCG/ACT nasal spray      2 sprays per nostril twice a day   17 g   12   . Norethindrone-Ethinyl Estradiol Biphasic (NECON 10/11, 28,) 0.5-35/1-35 MG-MCG tablet   Oral    Take 1 tablet by mouth daily.   1 Package   11   . PredniSONE 10 MG KIT      12 day taper dose pack, use as directed   48 each   0    BP 122/75  Pulse 84  Temp(Src) 98.6 F (37 C) (Oral)  Resp 14  SpO2 100%  LMP 03/12/2013 Physical Exam  Nursing note and vitals reviewed. Constitutional: She is oriented to person, place, and time. Vital signs are normal. She appears well-developed and well-nourished. No distress.  HENT:  Head: Normocephalic and atraumatic.  Right Ear: External ear normal.  Left Ear: External ear normal.  Nose: Nose normal.  Mouth/Throat: Oropharynx is clear and moist. No oropharyngeal exudate.  Eyes: Conjunctivae and EOM are normal. Pupils are equal, round, and reactive to light. Right eye exhibits no discharge. Left eye exhibits no discharge. No scleral icterus.  Neck: Normal range of motion. Neck supple. No tracheal deviation present. No thyromegaly present.  Cardiovascular: Normal rate, regular rhythm and normal heart sounds.  Exam reveals no gallop and no friction rub.   No murmur heard. Pulmonary/Chest: Effort normal and breath sounds normal. No respiratory distress. She has no wheezes. She has no rales.  Abdominal: Soft. Bowel sounds are normal. She exhibits no distension and no mass. There is no tenderness.  Musculoskeletal: Normal range of motion. She exhibits no edema and no tenderness.  Lymphadenopathy:    She has no cervical adenopathy.  Neurological: She is alert and oriented to person, place, and time. She has normal strength. No cranial nerve deficit.  Skin: Skin is warm and dry. She is not diaphoretic.  Psychiatric: She has a normal mood and affect. Her behavior is normal. Judgment normal.    ED Course  Procedures (including critical care time) Labs Reviewed  CBC WITH DIFFERENTIAL  COMPREHENSIVE METABOLIC PANEL   No results found. 1. Fatigue   2. Rhinitis     MDM  Comprehensive physical exam is again normal today. CBC and CMP were  sent which came back normal as well. We'll try treating this patient with prednisone and we'll prescribe Flonase because Nasonex was too expensive. This may in fact be related to the mold in her home, she has been advised to ask her landlord to have him checked from old and treated if necessary.  F/u with PCP if no improvement    Meds ordered this encounter  Medications  . PredniSONE 10 MG KIT    Sig: 12 day taper dose pack, use as directed    Dispense:  48 each    Refill:  0  . fluticasone (FLONASE) 50 MCG/ACT nasal spray    Sig: Place 2 sprays into the nose daily.    Dispense:  16 g  Refill:  2     Graylon Good, PA-C 05/27/13 2103

## 2013-05-27 NOTE — ED Notes (Signed)
Waiting for bloodwork

## 2013-05-27 NOTE — ED Notes (Signed)
Pt left w/o signing d/c instructions.

## 2013-05-27 NOTE — ED Notes (Addendum)
Pt c/o feeling fatigue onset 3 weeks... Reports she was seen on 6/24 for similar sxs... sxs are coming back but are worse... sxs include: ringing of both ears, dizziness, weakness, decreased appetite; though she is drinking fluids... Denies fevers... Believes it maybe due to mold in her apartment... Ambulated well to exam room... She is alert w/no signs of acute distress.

## 2013-05-28 NOTE — ED Provider Notes (Signed)
Medical screening examination/treatment/procedure(s) were performed by resident physician or non-physician practitioner and as supervising physician I was immediately available for consultation/collaboration.   Barkley Bruns MD.   Linna Hoff, MD 05/28/13 2029

## 2013-06-09 NOTE — ED Notes (Signed)
Chart review; asked if she should come in for recheck, as she is no better, and has not finished her prednisone. Advised to d/c the remaining prednisone ( has 3 pills left )

## 2013-06-09 NOTE — ED Notes (Signed)
States she thinks the prednisone is making her feel wierd

## 2013-06-09 NOTE — ED Notes (Signed)
Chart review.

## 2013-09-25 ENCOUNTER — Emergency Department (HOSPITAL_COMMUNITY)
Admission: EM | Admit: 2013-09-25 | Discharge: 2013-09-25 | Disposition: A | Discharge status: Home / Self Care | Payer: BC Managed Care – PPO | Source: Home / Self Care | Attending: Family Medicine | Admitting: Family Medicine

## 2013-09-25 ENCOUNTER — Encounter (HOSPITAL_COMMUNITY): Payer: Self-pay | Admitting: Emergency Medicine

## 2013-09-25 DIAGNOSIS — G9332 Myalgic encephalomyelitis/chronic fatigue syndrome: Secondary | ICD-10-CM

## 2013-09-25 DIAGNOSIS — R5382 Chronic fatigue, unspecified: Secondary | ICD-10-CM

## 2013-09-25 LAB — POCT I-STAT, CHEM 8
BUN: 18 mg/dL (ref 6–23)
Chloride: 104 mEq/L (ref 96–112)
Creatinine, Ser: 0.9 mg/dL (ref 0.50–1.10)
Glucose, Bld: 74 mg/dL (ref 70–99)
Potassium: 3.6 mEq/L (ref 3.5–5.1)
Sodium: 143 mEq/L (ref 135–145)

## 2013-09-25 LAB — POCT URINALYSIS DIP (DEVICE)
Bilirubin Urine: NEGATIVE
Ketones, ur: NEGATIVE mg/dL
Protein, ur: NEGATIVE mg/dL
Specific Gravity, Urine: 1.015 (ref 1.005–1.030)
pH: 7.5 (ref 5.0–8.0)

## 2013-09-25 NOTE — ED Provider Notes (Signed)
CSN: [id number]     Arrival date & time 09/25/13  1700 History   First MD Initiated Contact with Patient 09/25/13 1732     Chief Complaint  Patient presents with  . Fatigue   (Consider location/radiation/quality/duration/timing/severity/associated sxs/prior Treatment) Patient is a 32 y.o. female presenting with weakness. The history is provided by the patient.  Weakness This is a new problem. Episode onset: a few mos. The problem has not changed since onset.Pertinent negatives include no chest pain, no abdominal pain and no headaches.    Past Medical History  Diagnosis Date  . DUB (dysfunctional uterine bleeding) 02/07/2012  . Deafness in right ear   . Hx of meningitis    History reviewed. No pertinent past surgical history. Family History  Problem Relation Age of Onset  . Ovarian cysts Mother   . Ovarian cysts Sister    History  Substance Use Topics  . Smoking status: Never Smoker   . Smokeless tobacco: Not on file  . Alcohol Use: Yes     Comment: occasionally   OB History   Grav Para Term Preterm Abortions TAB SAB Ect Mult Living   0              Review of Systems  Constitutional: Positive for fatigue. Negative for fever, activity change and appetite change.  HENT: Negative.   Respiratory: Negative.   Cardiovascular: Negative for chest pain.  Gastrointestinal: Negative.  Negative for abdominal pain.  Genitourinary: Positive for menstrual problem.  Neurological: Positive for weakness. Negative for headaches.    Allergies  Review of patient's allergies indicates no known allergies.  Home Medications   Current Outpatient Rx  Name  Route  Sig  Dispense  Refill  . Chlorpheniramine-PSE-Ibuprofen (ADVIL ALLERGY SINUS) 2-30-200 MG TABS      2 tabs by mouth every 6 hours when necessary   84 each   0   . cyclobenzaprine (FLEXERIL) 10 MG tablet   Oral   Take 1 tablet (10 mg total) by mouth 2 (two) times daily as needed for muscle spasms.   20 tablet   0   .  fluticasone (FLONASE) 50 MCG/ACT nasal spray   Nasal   Place 2 sprays into the nose daily.   16 g   2   . ibuprofen (ADVIL,MOTRIN) 600 MG tablet   Oral   Take 1 tablet (600 mg total) by mouth every 6 (six) hours as needed for pain.   30 tablet   0   . meclizine (ANTIVERT) 25 MG tablet      1 tablet by mouth 4 times a day when necessary for vertigo   28 tablet   0   . methylPREDNISolone (MEDROL DOSEPAK) 4 MG tablet      Use taper dose pack as directed   21 tablet   0   . mometasone (NASONEX) 50 MCG/ACT nasal spray      2 sprays per nostril twice a day   17 g   12   . Norethindrone-Ethinyl Estradiol Biphasic (NECON 10/11, 28,) 0.5-35/1-35 MG-MCG tablet   Oral   Take 1 tablet by mouth daily.   1 Package   11   . PredniSONE 10 MG KIT      12 day taper dose pack, use as directed   48 each   0    BP 108/74  Pulse 80  Temp(Src) 98.4 F (36.9 C) (Oral)  Resp 16  SpO2 97%  LMP 09/07/2013 Physical Exam  Nursing note and  vitals reviewed. Constitutional: She is oriented to person, place, and time. She appears well-developed and well-nourished.  HENT:  Head: Normocephalic.  Right Ear: External ear normal.  Left Ear: External ear normal.  Mouth/Throat: Oropharynx is clear and moist.  Eyes: Conjunctivae are normal. Pupils are equal, round, and reactive to light.  Neck: Normal range of motion. Neck supple.  Cardiovascular: Regular rhythm and normal heart sounds.   Pulmonary/Chest: Breath sounds normal.  Abdominal: There is no tenderness.  Lymphadenopathy:    She has no cervical adenopathy.  Neurological: She is alert and oriented to person, place, and time.  Skin: Skin is warm and dry.    ED Course  Procedures (including critical care time) Labs Review Labs Reviewed  POCT I-STAT, CHEM 8 - Abnormal; Notable for the following:    Calcium, Ion 1.28 (*)    All other components within normal limits  POCT URINALYSIS DIP (DEVICE) - Abnormal; Notable for the  following:    Hgb urine dipstick TRACE (*)    All other components within normal limits   Imaging Review No results found.  EKG Interpretation    Date/Time:    Ventricular Rate:    PR Interval:    QRS Duration:   QT Interval:    QTC Calculation:   R Axis:     Text Interpretation:              MDM  i-stat and u/a wnl.    Linna Hoff, MD 09/25/13 [phone]

## 2013-09-25 NOTE — ED Notes (Signed)
Dr. Artis Flock in w/pt Pt c/o feeling tired for about 2 months Voices no other concerns other than irregular periods which are normal for her Alert w/no signs of acute distress.

## 2014-04-30 ENCOUNTER — Ambulatory Visit: Payer: BC Managed Care – PPO | Admitting: Obstetrics & Gynecology

## 2014-05-21 ENCOUNTER — Ambulatory Visit: Payer: BC Managed Care – PPO | Admitting: Obstetrics & Gynecology

## 2014-06-24 ENCOUNTER — Emergency Department (HOSPITAL_COMMUNITY)
Admission: EM | Admit: 2014-06-24 | Discharge: 2014-06-24 | Disposition: A | Discharge status: Home / Self Care | Payer: BC Managed Care – PPO | Source: Home / Self Care | Attending: Emergency Medicine | Admitting: Emergency Medicine

## 2014-06-24 ENCOUNTER — Encounter (HOSPITAL_COMMUNITY): Payer: Self-pay | Admitting: Emergency Medicine

## 2014-06-24 DIAGNOSIS — M6289 Other specified disorders of muscle: Secondary | ICD-10-CM

## 2014-06-24 DIAGNOSIS — R5381 Other malaise: Secondary | ICD-10-CM

## 2014-06-24 DIAGNOSIS — F419 Anxiety disorder, unspecified: Secondary | ICD-10-CM

## 2014-06-24 DIAGNOSIS — M629 Disorder of muscle, unspecified: Secondary | ICD-10-CM

## 2014-06-24 DIAGNOSIS — R079 Chest pain, unspecified: Secondary | ICD-10-CM

## 2014-06-24 DIAGNOSIS — F411 Generalized anxiety disorder: Secondary | ICD-10-CM

## 2014-06-24 DIAGNOSIS — R5383 Other fatigue: Secondary | ICD-10-CM

## 2014-06-24 LAB — POCT URINALYSIS DIP (DEVICE)
BILIRUBIN URINE: NEGATIVE
Glucose, UA: NEGATIVE mg/dL
Ketones, ur: NEGATIVE mg/dL
NITRITE: NEGATIVE
PH: 7 (ref 5.0–8.0)
Protein, ur: NEGATIVE mg/dL
Specific Gravity, Urine: 1.02 (ref 1.005–1.030)
Urobilinogen, UA: 0.2 mg/dL (ref 0.0–1.0)

## 2014-06-24 LAB — POCT I-STAT, CHEM 8
BUN: 13 mg/dL (ref 6–23)
Calcium, Ion: 1.26 mmol/L — ABNORMAL HIGH (ref 1.12–1.23)
Chloride: 105 mEq/L (ref 96–112)
Creatinine, Ser: 0.8 mg/dL (ref 0.50–1.10)
GLUCOSE: 84 mg/dL (ref 70–99)
HEMATOCRIT: 38 % (ref 36.0–46.0)
Hemoglobin: 12.9 g/dL (ref 12.0–15.0)
POTASSIUM: 3.6 meq/L — AB (ref 3.7–5.3)
Sodium: 142 mEq/L (ref 137–147)
TCO2: 26 mmol/L (ref 0–100)

## 2014-06-24 LAB — POCT PREGNANCY, URINE: Preg Test, Ur: NEGATIVE

## 2014-06-24 MED ORDER — BUSPIRONE HCL 7.5 MG PO TABS: 7.5000 mg | ORAL_TABLET | Freq: Three times a day (TID) | ORAL | Status: AC

## 2014-06-24 NOTE — Discharge Instructions (Signed)
Chest Pain (Nonspecific) °It is often hard to give a specific diagnosis for the cause of chest pain. There is always a chance that your pain could be related to something serious, such as a heart attack or a blood clot in the lungs. You need to follow up with your health care provider for further evaluation. °CAUSES  °· Heartburn. °· Pneumonia or bronchitis. °· Anxiety or stress. °· Inflammation around your heart (pericarditis) or lung (pleuritis or pleurisy). °· A blood clot in the lung. °· A collapsed lung (pneumothorax). It can develop suddenly on its own (spontaneous pneumothorax) or from trauma to the chest. °· Shingles infection (herpes zoster virus). °The chest wall is composed of bones, muscles, and cartilage. Any of these can be the source of the pain. °· The bones can be bruised by injury. °· The muscles or cartilage can be strained by coughing or overwork. °· The cartilage can be affected by inflammation and become sore (costochondritis). °DIAGNOSIS  °Lab tests or other studies may be needed to find the cause of your pain. Your health care provider may have you take a test called an ambulatory electrocardiogram (ECG). An ECG records your heartbeat patterns over a 24-hour period. You may also have other tests, such as: °· Transthoracic echocardiogram (TTE). During echocardiography, sound waves are used to evaluate how blood flows through your heart. °· Transesophageal echocardiogram (TEE). °· Cardiac monitoring. This allows your health care provider to monitor your heart rate and rhythm in real time. °· Holter monitor. This is a portable device that records your heartbeat and can help diagnose heart arrhythmias. It allows your health care provider to track your heart activity for several days, if needed. °· Stress tests by exercise or by giving medicine that makes the heart beat faster. °TREATMENT  °· Treatment depends on what may be causing your chest pain. Treatment may include: °¨ Acid blockers for  heartburn. °¨ Anti-inflammatory medicine. °¨ Pain medicine for inflammatory conditions. °¨ Antibiotics if an infection is present. °· You may be advised to change lifestyle habits. This includes stopping smoking and avoiding alcohol, caffeine, and chocolate. °· You may be advised to keep your head raised (elevated) when sleeping. This reduces the chance of acid going backward from your stomach into your esophagus. °Most of the time, nonspecific chest pain will improve within 2-3 days with rest and mild pain medicine.  °HOME CARE INSTRUCTIONS  °· If antibiotics were prescribed, take them as directed. Finish them even if you start to feel better. °· For the next few days, avoid physical activities that bring on chest pain. Continue physical activities as directed. °· Do not use any tobacco products, including cigarettes, chewing tobacco, or electronic cigarettes. °· Avoid drinking alcohol. °· Only take medicine as directed by your health care provider. °· Follow your health care provider's suggestions for further testing if your chest pain does not go away. °· Keep any follow-up appointments you made. If you do not go to an appointment, you could develop lasting (chronic) problems with pain. If there is any problem keeping an appointment, call to reschedule. °SEEK MEDICAL CARE IF:  °· Your chest pain does not go away, even after treatment. °· You have a rash with blisters on your chest. °· You have a fever. °SEEK IMMEDIATE MEDICAL CARE IF:  °· You have increased chest pain or pain that spreads to your arm, neck, jaw, back, or abdomen. °· You have shortness of breath. °· You have an increasing cough, or you cough   up blood. °· You have severe back or abdominal pain. °· You feel nauseous or vomit. °· You have severe weakness. °· You faint. °· You have chills. °This is an emergency. Do not wait to see if the pain will go away. Get medical help at once. Call your local emergency services (911 in [place] Do not drive  yourself to the hospital. °MAKE SURE YOU:  °· Understand these instructions. °· Will watch your condition. °· Will get help right away if you are not doing well or get worse. °Document Released: 08/02/2005 Document Revised: 10/28/2013 Document Reviewed: 05/28/2008 °[employer]® Patient Information ©2015 [employer]. This information is not intended to replace advice given to you by your health care provider. Make sure you discuss any questions you have with your health care provider. °Fatigue °Fatigue is a feeling of tiredness, lack of energy, lack of motivation, or feeling tired all the time. Having enough rest, good nutrition, and reducing stress will normally reduce fatigue. Consult your caregiver if it persists. The nature of your fatigue will help your caregiver to find out its cause. The treatment is based on the cause.  °CAUSES  °There are many causes for fatigue. Most of the time, fatigue can be traced to one or more of your habits or routines. Most causes fit into one or more of three general areas. They are: °Lifestyle problems °· Sleep disturbances. °· Overwork. °· Physical exertion. °· Unhealthy habits. °¨ Poor eating habits or eating disorders. °¨ Alcohol and/or drug use . °¨ Lack of proper nutrition (malnutrition). °Psychological problems °· Stress and/or anxiety problems. °· Depression. °· Grief. °· Boredom. °Medical Problems or Conditions °· Anemia. °· Pregnancy. °· Thyroid gland problems. °· Recovery from major surgery. °· Continuous pain. °· Emphysema or asthma that is not well controlled °· Allergic conditions. °· Diabetes. °· Infections (such as mononucleosis). °· Obesity. °· Sleep disorders, such as sleep apnea. °· Heart failure or other heart-related problems. °· Cancer. °· Kidney disease. °· Liver disease. °· Effects of certain medicines such as antihistamines, cough and cold remedies, prescription pain medicines, heart and blood pressure medicines, drugs used for treatment of cancer, and some  antidepressants. °SYMPTOMS  °The symptoms of fatigue include:  °· Lack of energy. °· Lack of drive (motivation). °· Drowsiness. °· Feeling of indifference to the surroundings. °DIAGNOSIS  °The details of how you feel help guide your caregiver in finding out what is causing the fatigue. You will be asked about your present and past health condition. It is important to review all medicines that you take, including prescription and non-prescription items. A thorough exam will be done. You will be questioned about your feelings, habits, and normal lifestyle. Your caregiver may suggest blood tests, urine tests, or other tests to look for common medical causes of fatigue.  °TREATMENT  °Fatigue is treated by correcting the underlying cause. For example, if you have continuous pain or depression, treating these causes will improve how you feel. Similarly, adjusting the dose of certain medicines will help in reducing fatigue.  °HOME CARE INSTRUCTIONS  °· Try to get the required amount of good sleep every night. °· Eat a healthy and nutritious diet, and drink enough water throughout the day. °· Practice ways of relaxing (including yoga or meditation). °· Exercise regularly. °· Make plans to change situations that cause stress. Act on those plans so that stresses decrease over time. Keep your work and personal routine reasonable. °· Avoid street drugs and minimize use of alcohol. °· Start taking   taking a daily multivitamin after consulting your caregiver. SEEK MEDICAL CARE IF:   You have persistent tiredness, which cannot be accounted for.  You have fever.  You have unintentional weight loss.  You have headaches.  You have disturbed sleep throughout the night.  You are feeling sad.  You have constipation.  You have dry skin.  You have gained weight.  You are taking any new or different medicines that you suspect are causing fatigue.  You are unable to sleep at night.  You develop any unusual swelling of  your legs or other parts of your body. SEEK IMMEDIATE MEDICAL CARE IF:   You are feeling confused.  Your vision is blurred.  You feel faint or pass out.  You develop severe headache.  You develop severe abdominal, pelvic, or back pain.  You develop chest pain, shortness of breath, or an irregular or fast heartbeat.  You are unable to pass a normal amount of urine.  You develop abnormal bleeding such as bleeding from the rectum or you vomit blood.  You have thoughts about harming yourself or committing suicide.  You are worried that you might harm someone else. MAKE SURE YOU:   Understand these instructions.  Will watch your condition.  Will get help right away if you are not doing well or get worse. Document Released: 08/20/2007 Document Revised: 01/15/2012 Document Reviewed: 02/24/2014 [facility] Patient Information 2015 [place], [place]. This information is not intended to replace advice given to you by your health care provider. Make sure you discuss any questions you have with your health care provider. Generalized Anxiety Disorder Generalized anxiety disorder (GAD) is a mental disorder. It interferes with life functions, including relationships, work, and school. GAD is different from normal anxiety, which everyone experiences at some point in their lives in response to specific life events and activities. Normal anxiety actually helps us prepare for and get through these life events and activities. Normal anxiety goes away after the event or activity is over.  GAD causes anxiety that is not necessarily related to specific events or activities. It also causes excess anxiety in proportion to specific events or activities. The anxiety associated with GAD is also difficult to control. GAD can vary from mild to severe. People with severe GAD can have intense waves of anxiety with physical symptoms (panic attacks).  SYMPTOMS The anxiety and worry associated with GAD are difficult to  control. This anxiety and worry are related to many life events and activities and also occur more days than not for 6 months or longer. People with GAD also have three or more of the following symptoms (one or more in children):  Restlessness.   Fatigue.  Difficulty concentrating.   Irritability.  Muscle tension.  Difficulty sleeping or unsatisfying sleep. DIAGNOSIS GAD is diagnosed through an assessment by your health care provider. Your health care provider will ask you questions aboutyour mood,physical symptoms, and events in your life. Your health care provider may ask you about your medical history and use of alcohol or drugs, including prescription medicines. Your health care provider may also do a physical exam and blood tests. Certain medical conditions and the use of certain substances can cause symptoms similar to those associated with GAD. Your health care provider may refer you to a mental health specialist for further evaluation. TREATMENT The following therapies are usually used to treat GAD:   Medication. Antidepressant medication usually is prescribed for long-term daily control. Antianxiety medicines may be added in severe cases, especially  when panic attacks occur.   Talk therapy (psychotherapy). Certain types of talk therapy can be helpful in treating GAD by providing support, education, and guidance. A form of talk therapy called cognitive behavioral therapy can teach you healthy ways to think about and react to daily life events and activities.  Stress managementtechniques. These include yoga, meditation, and exercise and can be very helpful when they are practiced regularly. A mental health specialist can help determine which treatment is best for you. Some people see improvement with one therapy. However, other people require a combination of therapies. Document Released: 02/17/2013 Document Revised: 03/09/2014 Document Reviewed: 02/17/2013 [facility] Patient  Information 2015 [place], [place]. This information is not intended to replace advice given to you by your health care provider. Make sure you discuss any questions you have with your health care provider.

## 2014-06-24 NOTE — ED Notes (Signed)
C/o intermittent left side pain/numbness and HA onset 3 weeks Also reports contraction of left hand and left calf muscle and slurry speech.  Denies blurry vision, n/v/d, weakness Alert, talking in complete sentences, no signs of acute distress.

## 2014-06-24 NOTE — ED Provider Notes (Signed)
Chief Complaint   Chief Complaint  Patient presents with  . Generalized Body Aches    History of Present Illness   Stacie [name] is a 33 year old female who presents with a two-week history of a stress, inability to relax, and multiple other symptoms. The patient states the entire left side of her body feels tight, sometimes as if it's on fire, weak, painful, and tingly. The patient had a a muscle cramp in her left calf last night. Sometimes she feels like she has difficulty finding words. She notes aching in her chest on the left side and mild shortness of breath. Her chest feels tight with exertion. Last night she felt dizzy. She is sensitive to loud noises. She's tired and rundown he is unable to sleep. She's had intermittent headaches. Her left hand and both feet feel numb and tingly. She has difficulty concentrating abdominal pain. She denies any depression or suicidal ideation. She states she's stressed out by work and school. She states her work is not very reporting her challenging and she is looking for a new job. School another hand is very difficult and she is really struggling to get through her classes.  Review of Systems     Other than as noted above, the patient denies any of the following symptoms: Systemic:  No fever, chills, fatigue, myalgias, headache, or anorexia. Eye:  No redness, pain or drainage. ENT:  No earache, nasal congestion, rhinorrhea, sinus pressure, or sore throat. Lungs:  No cough, sputum production, wheezing, shortness of breath.  Cardiovascular:  No chest pain, palpitations, or syncope. GI:  No nausea, vomiting, abdominal pain or diarrhea. GU:  No dysuria, frequency, or hematuria. Skin:  No rash or pruritis.   PMFSH     Past medical history, family history, social history, meds, and allergies were reviewed.    Physical Examination    Vital signs:  BP 115/68  Pulse 86  Resp 14  SpO2 100%  LMP 06/10/2014 General:  Alert, in no distress. She  appears to be hard of hearing. 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 Eye:  PERRL, full EOMs.  Lids and conjunctivas were normal. ENT:  TMs and canals were normal, without erythema or inflammation.  Nasal mucosa was clear and uncongested, without drainage.  Mucous membranes were moist.  Pharynx was clear, without exudate or drainage.  There were no oral ulcerations or lesions. Neck:  Supple, no adenopathy, tenderness or mass. Thyroid was normal. Lungs:  No respiratory distress.  Lungs were clear to auscultation, without wheezes, rales or rhonchi.  Breath sounds were clear and equal bilaterally. Heart:  Regular rhythm, without gallops, murmers or rubs. Abdomen:  Soft, flat, and non-tender to palpation.  No hepatosplenomagaly or mass. Extremities: No swelling or edema, pulses were full, no calf tenderness, and Homans sign was negative. Neurological examination: The patient is alert and oriented x3. Speech is clear, fluent, and appropriate. Cranial nerves are intact. There is no pronator drift and finger to nose was normal. Muscle strength, sensation, and DTRs are normal. Babinskis are downgoing. Station and gait were normal. Romberg sign is negative, patient is able to perform tandem gait well. Skin:  Clear, warm, and dry, without rash or lesions.  Labs    Results for orders placed during the hospital encounter of 06/24/14  POCT URINALYSIS DIP (DEVICE)      Result Value Ref Range   Glucose, UA NEGATIVE  NEGATIVE mg/dL   Bilirubin Urine NEGATIVE  NEGATIVE  Ketones, ur NEGATIVE  NEGATIVE mg/dL   Specific Gravity, Urine 1.020  1.005 - 1.030   Hgb urine dipstick SMALL (*) NEGATIVE   pH 7.0  5.0 - 8.0   Protein, ur NEGATIVE  NEGATIVE mg/dL   Urobilinogen, UA 0.2  0.0 - 1.0 mg/dL   Nitrite NEGATIVE  NEGATIVE   Leukocytes, UA TRACE (*) NEGATIVE  POCT PREGNANCY, URINE      Result Value Ref Range   Preg Test, Ur NEGATIVE  NEGATIVE  POCT I-STAT, CHEM 8       Result Value Ref Range   Sodium 142  137 - 147 mEq/L   Potassium 3.6 (*) 3.7 - 5.3 mEq/L   Chloride 105  96 - 112 mEq/L   BUN 13  6 - 23 mg/dL   Creatinine, Ser 1.61  0.50 - 1.10 mg/dL   Glucose, Bld 84  70 - 99 mg/dL   Calcium, Ion 0.96 (*) 1.12 - 1.23 mmol/L   TCO2 26  0 - 100 mmol/L   Hemoglobin 12.9  12.0 - 15.0 g/dL   HCT 04.5  40.9 - 81.1 %    EKG Results:  Date: 06/24/2014  Rate: 71  Rhythm: normal sinus rhythm and sinus arrhythmia  QRS Axis: normal--77  Intervals: normal  ST/T Wave abnormalities: normal  Conduction Disutrbances:none  Narrative Interpretation: Sinus rhythm with marked sinus arrhythmia, otherwise normal EKG.  Old EKG Reviewed: none available   Assessment   The primary encounter diagnosis was Anxiety. Diagnoses of Muscle tightness, Chest pain, unspecified chest pain type, and Other fatigue were also pertinent to this visit.  Patient has multiple symptoms and nothing in the way of physical or findings to explain her symptomatology. I suspect that this is mostly due to stress, anxiety, and worry. Have advised her to followup with primary care physician.  Plan     1.  Meds:  The following meds were prescribed:   Discharge Medication List as of 06/24/2014 10:46 AM    START taking these medications   Details  busPIRone (BUSPAR) 7.5 MG tablet Take 1 tablet (7.5 mg total) by mouth 3 (three) times daily., Starting 06/24/2014, Until Discontinued, Normal        2.  Patient Education/Counseling:  The patient was given appropriate handouts, self care instructions, and instructed in symptomatic relief.    3.  Follow up:  The patient was told to follow up here if no better in 3 to 4 days, or sooner if becoming worse in any way, and given some red flag symptoms such as any new or changing neurological or chest symptoms which would prompt immediate return.  Follow up with a primary care physician within one week.        Reuben Likes, MD 06/24/14 [phone]

## 2014-06-25 ENCOUNTER — Ambulatory Visit: Payer: BC Managed Care – PPO | Admitting: Obstetrics & Gynecology

## 2014-07-14 ENCOUNTER — Encounter (HOSPITAL_COMMUNITY): Payer: Self-pay | Admitting: Emergency Medicine

## 2014-07-14 ENCOUNTER — Emergency Department (INDEPENDENT_AMBULATORY_CARE_PROVIDER_SITE_OTHER)
Admission: EM | Admit: 2014-07-14 | Discharge: 2014-07-14 | Disposition: A | Discharge status: Home / Self Care | Payer: BC Managed Care – PPO | Source: Home / Self Care | Attending: Family Medicine | Admitting: Family Medicine

## 2014-07-14 DIAGNOSIS — J069 Acute upper respiratory infection, unspecified: Secondary | ICD-10-CM

## 2014-07-14 MED ORDER — IPRATROPIUM BROMIDE 0.06 % NA SOLN: 2.0000 | Freq: Four times a day (QID) | NASAL | Status: AC

## 2014-07-14 MED ORDER — CETIRIZINE HCL 10 MG PO TABS: 10.0000 mg | ORAL_TABLET | Freq: Every day | ORAL | Status: AC

## 2014-07-14 NOTE — ED Notes (Signed)
Pt  Has        Congestion   And  Cough   Since  yest           Has  Used  claritin  And  Benadryl  With  No  releif  No  Fever       Appears  In no  Acute  Distress

## 2014-07-14 NOTE — ED Provider Notes (Signed)
CSN: [id number]     Arrival date & time 07/14/14  1051 History   First MD Initiated Contact with Patient 07/14/14 1151     Chief Complaint  Patient presents with  . URI   (Consider location/radiation/quality/duration/timing/severity/associated sxs/prior Treatment) Patient is a 33 y.o. female presenting with URI. The history is provided by the patient.  URI Presenting symptoms: congestion and rhinorrhea   Presenting symptoms: no cough, no fever and no sore throat   Severity:  Mild Onset quality:  Gradual Duration:  1 day Progression:  Unchanged Chronicity:  New Relieved by:  Decongestant Worsened by:  Nothing tried Ineffective treatments:  None tried Associated symptoms: myalgias and sneezing   Risk factors: no sick contacts     Past Medical History  Diagnosis Date  . DUB (dysfunctional uterine bleeding) 02/07/2012  . Deafness in right ear   . Hx of meningitis    History reviewed. No pertinent past surgical history. Family History  Problem Relation Age of Onset  . Ovarian cysts Mother   . Ovarian cysts Sister    History  Substance Use Topics  . Smoking status: Never Smoker   . Smokeless tobacco: Not on file  . Alcohol Use: Yes     Comment: occasionally   OB History   Grav Para Term Preterm Abortions TAB SAB Ect Mult Living   0              Review of Systems  Constitutional: Negative.  Negative for fever.  HENT: Positive for congestion, postnasal drip, rhinorrhea and sneezing. Negative for sore throat.   Respiratory: Negative for cough.   Gastrointestinal: Negative.   Genitourinary: Negative.   Musculoskeletal: Positive for myalgias.    Allergies  Review of patient's allergies indicates no known allergies.  Home Medications   Prior to Admission medications   Medication Sig Start Date End Date Taking? Authorizing Provider  busPIRone (BUSPAR) 7.5 MG tablet Take 1 tablet (7.5 mg total) by mouth 3 (three) times daily. 06/24/14   Harden Mo, MD  cetirizine  (ZYRTEC) 10 MG tablet Take 1 tablet (10 mg total) by mouth daily. One tab daily for allergies 07/14/14   Billy Fischer, MD  Chlorpheniramine-PSE-Ibuprofen (ADVIL ALLERGY SINUS) 2-30-200 MG TABS 2 tabs by mouth every 6 hours when necessary 04/29/13   Liam Graham, PA-C  cyclobenzaprine (FLEXERIL) 10 MG tablet Take 1 tablet (10 mg total) by mouth 2 (two) times daily as needed for muscle spasms. 09/20/12   Illene Labrador, PA-C  fluticasone (FLONASE) 50 MCG/ACT nasal spray Place 2 sprays into the nose daily. 05/27/13   Liam Graham, PA-C  ibuprofen (ADVIL,MOTRIN) 600 MG tablet Take 1 tablet (600 mg total) by mouth every 6 (six) hours as needed for pain. 09/20/12   Illene Labrador, PA-C  ipratropium (ATROVENT) 0.06 % nasal spray Place 2 sprays into both nostrils 4 (four) times daily. 07/14/14   Billy Fischer, MD  meclizine (ANTIVERT) 25 MG tablet 1 tablet by mouth 4 times a day when necessary for vertigo 04/29/13   Liam Graham, PA-C  methylPREDNISolone (MEDROL DOSEPAK) 4 MG tablet Use taper dose pack as directed 04/29/13   Liam Graham, PA-C  mometasone (NASONEX) 50 MCG/ACT nasal spray 2 sprays per nostril twice a day 04/29/13   Liam Graham, PA-C  Norethindrone-Ethinyl Estradiol Biphasic (NECON 10/11, 28,) 0.5-35/1-35 MG-MCG tablet Take 1 tablet by mouth daily. 09/23/12   Emily Filbert, MD  PredniSONE 10 MG KIT 12 day  taper dose pack, use as directed 05/27/13   Liam Graham, PA-C   BP 101/70  Pulse 101  Temp(Src) 99.3 F (37.4 C) (Oral)  Resp 16  SpO2 97%  LMP 06/10/2014 Physical Exam  Nursing note and vitals reviewed. Constitutional: She is oriented to person, place, and time. She appears well-developed and well-nourished. No distress.  HENT:  Head: Normocephalic.  Right Ear: External ear normal.  Left Ear: External ear normal.  Nose: Mucosal edema and rhinorrhea present.  Mouth/Throat: Oropharynx is clear and moist.  Neck: Normal range of motion. Neck supple.  Cardiovascular: Normal  rate and normal heart sounds.   Pulmonary/Chest: Effort normal and breath sounds normal. She has no wheezes.  Lymphadenopathy:    She has no cervical adenopathy.  Neurological: She is alert and oriented to person, place, and time.  Skin: Skin is warm and dry.    ED Course  Procedures (including critical care time) Labs Review Labs Reviewed - No data to display  Imaging Review No results found.   MDM   1. URI (upper respiratory infection)        Billy Fischer, MD 07/14/14 1207

## 2014-07-31 ENCOUNTER — Ambulatory Visit (INDEPENDENT_AMBULATORY_CARE_PROVIDER_SITE_OTHER): Payer: BC Managed Care – PPO | Admitting: Physician Assistant

## 2014-07-31 ENCOUNTER — Encounter: Payer: Self-pay | Admitting: Physician Assistant

## 2014-07-31 VITALS — BP 116/76 | HR 84 | Temp 97.6°F | Ht 67.0 in | Wt 103.8 lb

## 2014-07-31 DIAGNOSIS — Z01419 Encounter for gynecological examination (general) (routine) without abnormal findings: Secondary | ICD-10-CM

## 2014-07-31 DIAGNOSIS — Z3009 Encounter for other general counseling and advice on contraception: Secondary | ICD-10-CM

## 2014-07-31 DIAGNOSIS — Z3202 Encounter for pregnancy test, result negative: Secondary | ICD-10-CM

## 2014-07-31 LAB — POCT PREGNANCY, URINE: PREG TEST UR: NEGATIVE

## 2014-07-31 MED ORDER — NORETHIN-ETH ESTRAD BIPHASIC 0.5-35/1-35 MG-MCG PO TABS: 1.0000 | ORAL_TABLET | Freq: Every day | ORAL | Status: DC

## 2014-07-31 NOTE — Progress Notes (Signed)
Subjective:     Patient ID: Stacie [name], female   DOB: 1981/04/16, 33 y.o.   MRN: [id number]  HPI Stacie [name] 33 y.o. G0P0 presents for eval of no periods in several months and unusual discharge.  She requests STD testing.  She reports she has had episodes of amenorrhea previously that were addressed with OCP.  She has had no recent stressors and no changes in diet or medications except that she has chosen to discontinue her Buspar.  Her vaginal discharge presently is white but previously was green.  She has not had sex in many weeks.    Review of Systems  Constitutional: Negative for fever, chills, diaphoresis, activity change and appetite change.  HENT: Negative for congestion and sore throat.   Respiratory: Negative for chest tightness, shortness of breath and wheezing.   Cardiovascular: Negative for chest pain and palpitations.  Genitourinary: Negative for dysuria, difficulty urinating and pelvic pain.  Psychiatric/Behavioral: Negative for suicidal ideas and behavioral problems.       Objective:   Physical Exam  Constitutional: She is oriented to person, place, and time.  Thin, well developed, no acute distress  HENT:  Head: Normocephalic and atraumatic.  Hearing aide in place  Eyes: EOM are normal.  Neck: Normal range of motion.  Cardiovascular: Normal rate and regular rhythm.   Pulmonary/Chest: Effort normal and breath sounds normal. No respiratory distress.  Abdominal: Soft. Bowel sounds are normal. She exhibits no distension. There is no tenderness.  Genitourinary: Uterus normal.  Thin white discharge in vault No CMT No adnexal tenderness or mass  Musculoskeletal: Normal range of motion.  Neurological: She is alert and oriented to person, place, and time.  Skin: Skin is warm and dry.  Psychiatric: She has a normal mood and affect.       Assessment:     Negative well woman exam     Plan:     Restart OCP STD screening performed RTC PRN, 1 year for annual  exam

## 2014-07-31 NOTE — Patient Instructions (Addendum)
Return to clinic if no improvement in symptoms  Oral Contraception Use Oral contraceptive pills (OCPs) are medicines taken to prevent pregnancy. OCPs work by preventing the ovaries from releasing eggs. The hormones in OCPs also cause the cervical mucus to thicken, preventing the sperm from entering the uterus. The hormones also cause the uterine lining to become thin, not allowing a fertilized egg to attach to the inside of the uterus. OCPs are highly effective when taken exactly as prescribed. However, OCPs do not prevent sexually transmitted diseases (STDs). Safe sex practices, such as using condoms along with an OCP, can help prevent STDs. Before taking OCPs, you may have a physical exam and Pap test. Your health care provider may also order blood tests if necessary. Your health care provider will make sure you are a good candidate for oral contraception. Discuss with your health care provider the possible side effects of the OCP you may be prescribed. When starting an OCP, it can take 2 to 3 months for the body to adjust to the changes in hormone levels in your body.  HOW TO TAKE ORAL CONTRACEPTIVE PILLS Your health care provider may advise you on how to start taking the first cycle of OCPs. Otherwise, you can:   Start on day 1 of your menstrual period. You will not need any backup contraceptive protection with this start time.   Start on the first Sunday after your menstrual period or the day you get your prescription. In these cases, you will need to use backup contraceptive protection for the first week.   Start the pill at any time of your cycle. If you take the pill within 5 days of the start of your period, you are protected against pregnancy right away. In this case, you will not need a backup form of birth control. If you start at any other time of your menstrual cycle, you will need to use another form of birth control for 7 days. If your OCP is the type called a minipill, it will protect  you from pregnancy after taking it for 2 days (48 hours). After you have started taking OCPs:   If you forget to take 1 pill, take it as soon as you remember. Take the next pill at the regular time.   If you miss 2 or more pills, call your health care provider because different pills have different instructions for missed doses. Use backup birth control until your next menstrual period starts.   If you use a 28-day pack that contains inactive pills and you miss 1 of the last 7 pills (pills with no hormones), it will not matter. Throw away the rest of the non-hormone pills and start a new pill pack.  No matter which day you start the OCP, you will always start a new pack on that same day of the week. Have an extra pack of OCPs and a backup contraceptive method available in case you miss some pills or lose your OCP pack.  HOME CARE INSTRUCTIONS   Do not smoke.   Always use a condom to protect against STDs. OCPs do not protect against STDs.   Use a calendar to mark your menstrual period days.   Read the information and directions that came with your OCP. Talk to your health care provider if you have questions.  SEEK MEDICAL CARE IF:   You develop nausea and vomiting.   You have abnormal vaginal discharge or bleeding.   You develop a rash.   You  miss your menstrual period.   You are losing your hair.   You need treatment for mood swings or depression.   You get dizzy when taking the OCP.   You develop acne from taking the OCP.   You become pregnant.  SEEK IMMEDIATE MEDICAL CARE IF:   You develop chest pain.   You develop shortness of breath.   You have an uncontrolled or severe headache.   You develop numbness or slurred speech.   You develop visual problems.   You develop pain, redness, and swelling in the legs.  Document Released: 10/12/2011 Document Revised: 03/09/2014 Document Reviewed: 04/13/2013 [facility] Patient Information 2015 [place],  [place]. This information is not intended to replace advice given to you by your health care provider. Make sure you discuss any questions you have with your health care provider.

## 2014-08-01 LAB — WET PREP, GENITAL
Trich, Wet Prep: NONE SEEN
Yeast Wet Prep HPF POC: NONE SEEN

## 2014-08-01 LAB — GC/CHLAMYDIA PROBE AMP
CT Probe RNA: NEGATIVE
GC PROBE AMP APTIMA: NEGATIVE

## 2015-05-19 ENCOUNTER — Ambulatory Visit: Payer: Self-pay | Admitting: Family Medicine

## 2015-05-20 ENCOUNTER — Ambulatory Visit
Admission: RE | Admit: 2015-05-20 | Discharge: 2015-05-20 | Disposition: A | Discharge status: Home / Self Care | Payer: BLUE CROSS/BLUE SHIELD | Source: Ambulatory Visit | Attending: Physician Assistant | Admitting: Physician Assistant

## 2015-05-20 ENCOUNTER — Other Ambulatory Visit: Payer: Self-pay | Admitting: Physician Assistant

## 2015-05-20 DIAGNOSIS — M542 Cervicalgia: Secondary | ICD-10-CM

## 2015-05-20 DIAGNOSIS — M5489 Other dorsalgia: Secondary | ICD-10-CM

## 2015-08-02 ENCOUNTER — Other Ambulatory Visit: Payer: Self-pay | Admitting: Physician Assistant

## 2015-08-03 ENCOUNTER — Telehealth: Payer: Self-pay | Admitting: *Deleted

## 2015-08-03 DIAGNOSIS — Z3009 Encounter for other general counseling and advice on contraception: Secondary | ICD-10-CM

## 2015-08-03 NOTE — Telephone Encounter (Signed)
Received message left on nurse line on 08/03/15 at 1157.  Patient states she needs a refill on her prescription and the pharmacist told her to call our office.  Requests a return call.

## 2015-08-09 NOTE — Telephone Encounter (Signed)
Attempted to contact patient, no answer, left message for patient to return call to the clinic. 

## 2015-08-16 MED ORDER — NORETHIN-ETH ESTRAD BIPHASIC 0.5-35/1-35 MG-MCG PO TABS: 1.0000 | ORAL_TABLET | Freq: Every day | ORAL | Status: AC

## 2015-08-16 NOTE — Telephone Encounter (Signed)
Refill on birth control pills sent to pharmacy per protocol.

## 2015-12-10 IMAGING — CR DG CERVICAL SPINE COMPLETE 4+V
5 series · 5 of 5 positions shown · non-contrast
Comparison: None available in PACs

CLINICAL DATA: Chronic generalized spinal pain for 2 years without
history of trauma

EXAM:
CERVICAL SPINE  4+ VIEWS

[w c-spine lat]
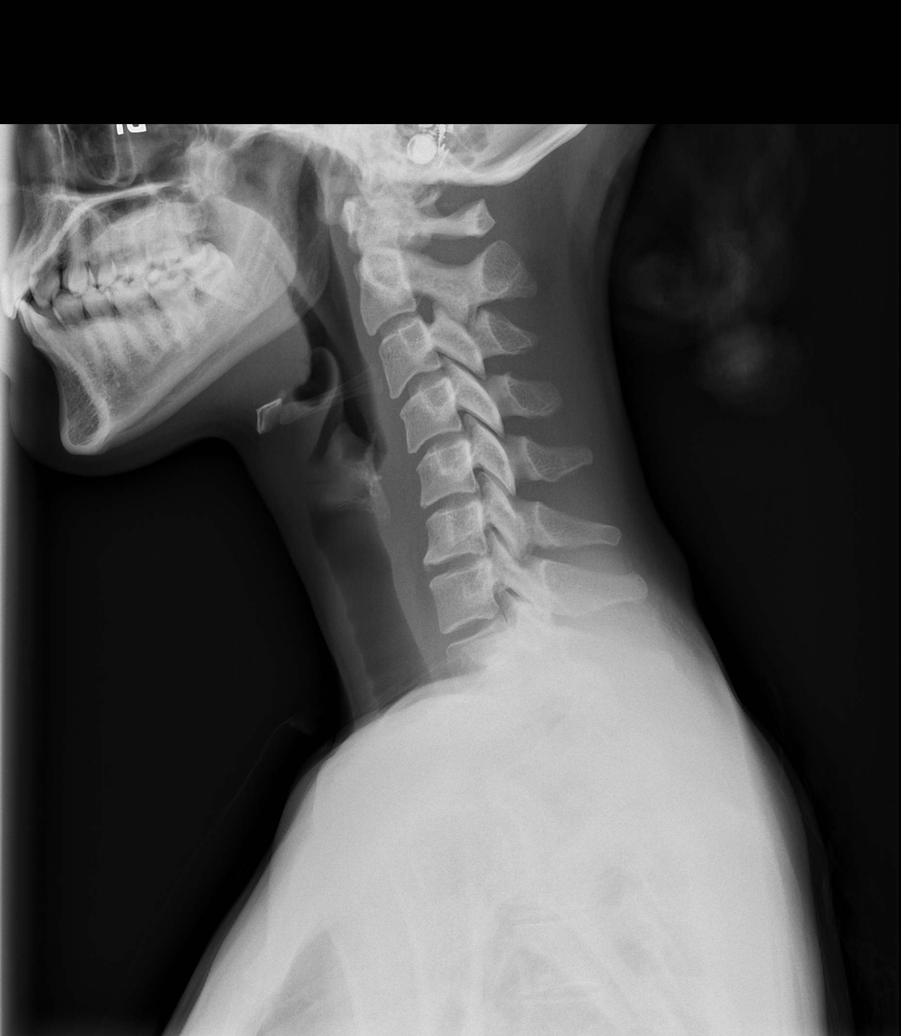

[w c-spine oblique (1 of 2)]
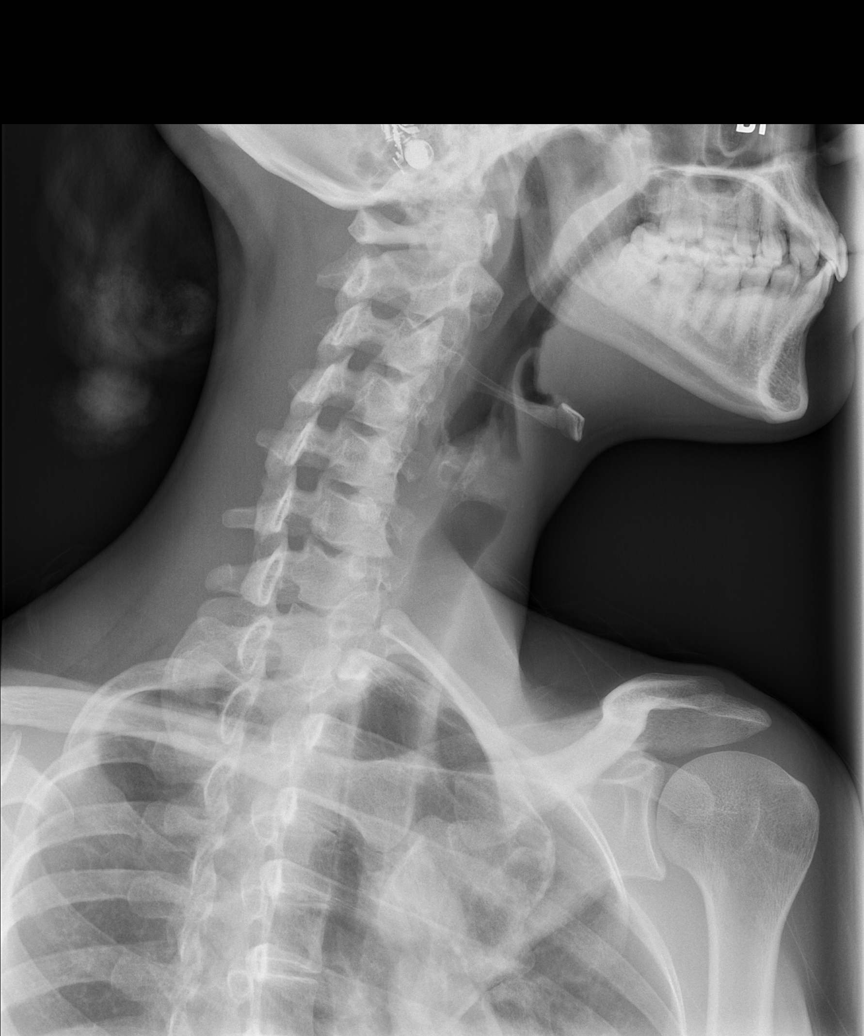

[w c-spine oblique (2 of 2)]
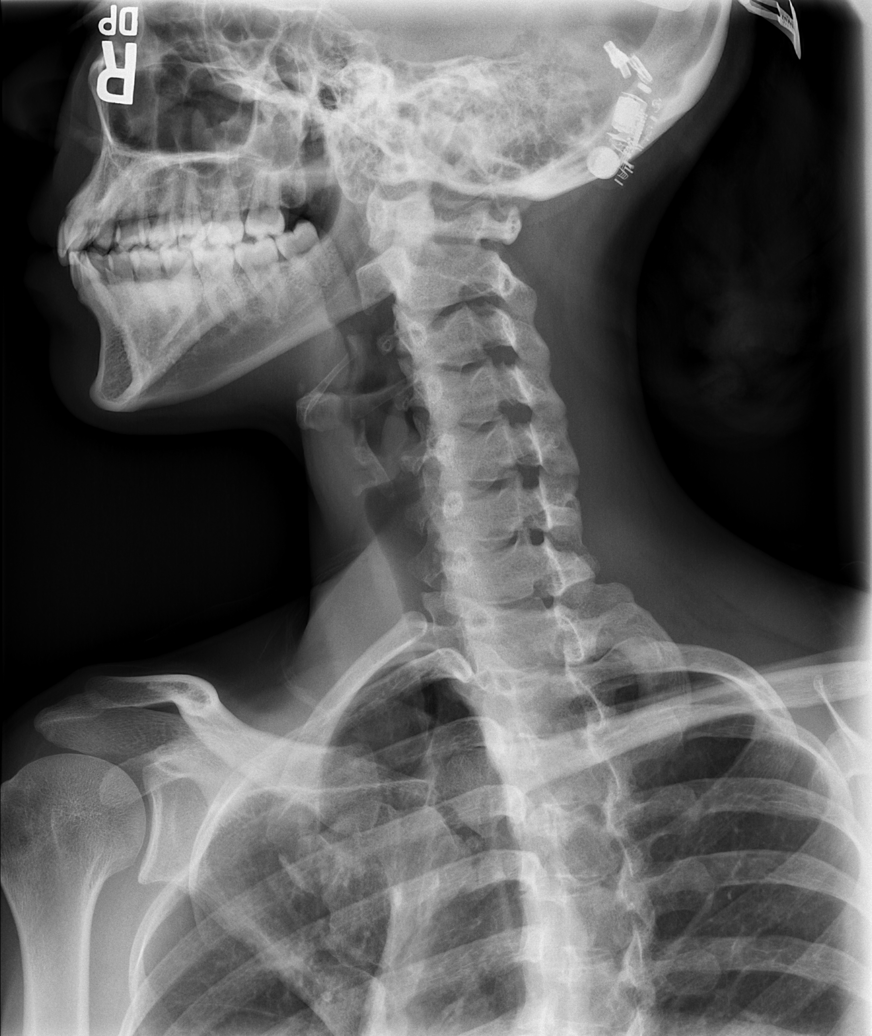

[w c-spine a.p. *]
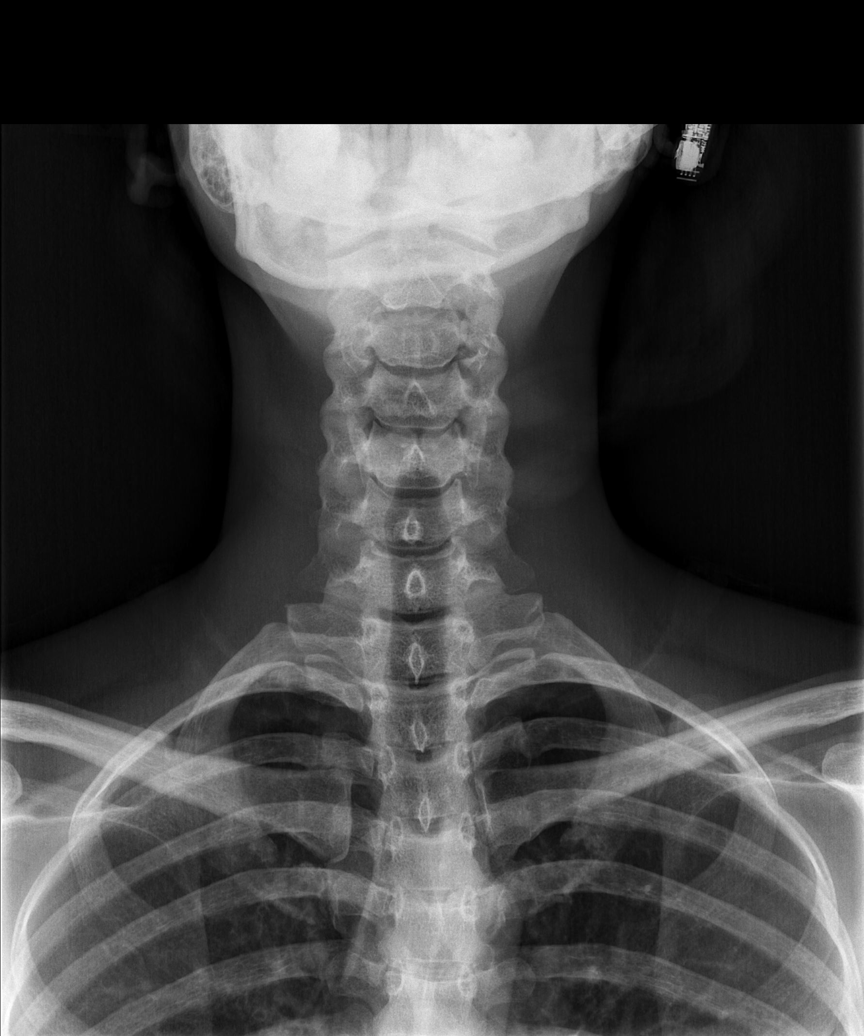

[w c-spine odontoid *]
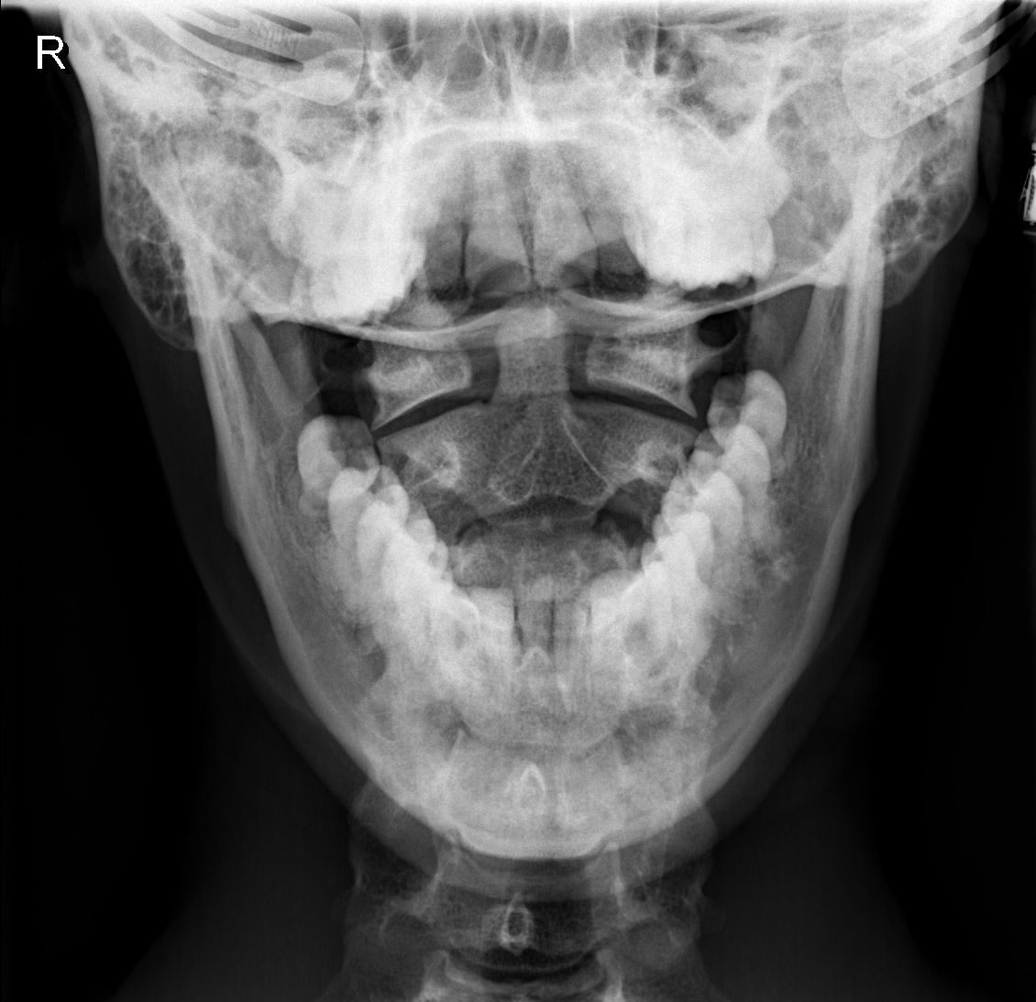

[5 of 5 positions shown; findings below may reference images not displayed]

FINDINGS: There is mild reversal of the normal cervical lordosis. The
vertebral bodies are preserved in height. The intervertebral disc
space heights are reasonably well maintained. The prevertebral soft
tissue spaces are normal. There is a tiny anterior inferior endplate
spur at C6. There is no perched facet. The spinous processes are
intact. The oblique views reveal no significant bony encroachment
upon the neural foramina. The odontoid is intact.
IMPRESSION: There is reversal of the normal cervical lordosis consistent with
muscle spasm. Minimal anterior endplate spurring at C6. There is no
acute cervical spine abnormality.

## 2019-07-04 ENCOUNTER — Other Ambulatory Visit: Payer: Self-pay

## 2019-07-04 DIAGNOSIS — Z20822 Contact with and (suspected) exposure to covid-19: Secondary | ICD-10-CM

## 2019-07-06 LAB — NOVEL CORONAVIRUS, NAA: SARS-CoV-2, NAA: NOT DETECTED

## 2019-07-29 ENCOUNTER — Other Ambulatory Visit: Payer: Self-pay

## 2019-07-29 DIAGNOSIS — Z20822 Contact with and (suspected) exposure to covid-19: Secondary | ICD-10-CM

## 2019-07-31 ENCOUNTER — Telehealth: Payer: Self-pay | Admitting: General Practice

## 2019-07-31 LAB — NOVEL CORONAVIRUS, NAA: SARS-CoV-2, NAA: NOT DETECTED

## 2019-07-31 NOTE — Telephone Encounter (Signed)
Patient called in and informed of negative test results.  

## 2019-12-07 ENCOUNTER — Emergency Department (HOSPITAL_COMMUNITY)
Admission: EM | Admit: 2019-12-07 | Discharge: 2019-12-07 | Disposition: A | Discharge status: Home / Self Care | Payer: Self-pay | Attending: Emergency Medicine | Admitting: Emergency Medicine

## 2019-12-07 ENCOUNTER — Encounter (HOSPITAL_COMMUNITY): Payer: Self-pay | Admitting: Emergency Medicine

## 2019-12-07 ENCOUNTER — Other Ambulatory Visit: Payer: Self-pay

## 2019-12-07 DIAGNOSIS — Z79899 Other long term (current) drug therapy: Secondary | ICD-10-CM | POA: Insufficient documentation

## 2019-12-07 DIAGNOSIS — H9192 Unspecified hearing loss, left ear: Secondary | ICD-10-CM | POA: Insufficient documentation

## 2019-12-07 NOTE — ED Triage Notes (Signed)
Patient c/o hearing feeling distorted for a few days. States she had her hearing aid checked and it was not the problem. Informed she needs hearing exam but not able to get appt until 10th.

## 2019-12-07 NOTE — ED Provider Notes (Signed)
[facility] Provider Note   CSN: [id number] Arrival date & time: 12/07/19  1540     History Chief Complaint  Patient presents with  . Hearing Problem    Stacie [name] is a 39 y.o. 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 She reports that for the past 4 to 5 days she has felt like sounds are muffled like someone speaking to her is underwater.  She went to her hearing aid specialist who reports that her hearing aids are functioning normally.  She has an appointment with her hearing specialist, however that is not until next month.  She denies any weakness, numbness or tingling.  No headaches.  No dizziness vision changes or feeling off balance.  She states that she has had this before when she has had a cold and nasal congestion however it has never been this bad.    She also reports that her apartment is very dry without a lot of humidity.  She denies any trauma.  She has not tried any decongestants.  HPI     Past Medical History:  Diagnosis Date  . Deafness in right ear   . DUB (dysfunctional uterine bleeding) 02/07/2012  . Hx of meningitis     Patient Active Problem List   Diagnosis Date Noted  . General counseling for prescription of oral contraceptives 07/31/2014  . DUB (dysfunctional uterine bleeding) 02/07/2012    History reviewed. No pertinent surgical history.   OB History    Gravida  0   Para      Term      Preterm      AB      Living        SAB      TAB      Ectopic      Multiple      Live Births              Family History  Problem Relation Age of Onset  . Ovarian cysts Mother   . Ovarian cysts Sister     Social History   Tobacco Use  . Smoking status: Never Smoker  . Smokeless tobacco: Never Used  Substance Use Topics  .  Alcohol use: Yes    Comment: occasionally  . Drug use: No    Home Medications Prior to Admission medications   Medication Sig Start Date End Date Taking? Authorizing Provider  busPIRone (BUSPAR) 7.5 MG tablet Take 1 tablet (7.5 mg total) by mouth 3 (three) times daily. 06/24/14   Harden Mo, MD  cetirizine (ZYRTEC) 10 MG tablet Take 1 tablet (10 mg total) by mouth daily. One tab daily for allergies 07/14/14   Billy Fischer, MD  Chlorpheniramine-PSE-Ibuprofen (ADVIL ALLERGY SINUS) 2-30-200 MG TABS 2 tabs by mouth every 6 hours when necessary 04/29/13   Liam Graham, PA-C  cyclobenzaprine (FLEXERIL) 10 MG tablet Take 1 tablet (10 mg total) by mouth 2 (two) times daily as needed for muscle spasms. 09/20/12   Hess, Hessie Diener, PA-C  fluticasone (FLONASE) 50 MCG/ACT nasal spray Place 2 sprays into the nose daily. 05/27/13   Luana Shu, Freeman Caldron, PA-C  ibuprofen (ADVIL,MOTRIN) 600 MG tablet Take 1 tablet (600 mg total) by mouth every 6 (six) hours as needed for pain. 09/20/12   Hess, Hessie Diener, PA-C  ipratropium (ATROVENT) 0.06 % nasal spray Place 2 sprays into both nostrils 4 (four) times daily. 07/14/14   Billy Fischer, MD  meclizine (ANTIVERT) 25 MG tablet 1 tablet by mouth 4 times a day when necessary for vertigo 04/29/13   Liam Graham, PA-C  methylPREDNISolone (MEDROL DOSEPAK) 4 MG tablet Use taper dose pack as directed 04/29/13   Liam Graham, PA-C  mometasone (NASONEX) 50 MCG/ACT nasal spray 2 sprays per nostril twice a day 04/29/13   Liam Graham, PA-C  Norethindrone-Ethinyl Estradiol Biphasic (NECON 10/11, 28,) 0.5-35/1-35 MG-MCG tablet Take 1 tablet by mouth daily. 08/16/15   Osborne Oman, MD  PredniSONE 10 MG KIT 12 day taper dose pack, use as directed 05/27/13   Liam Graham, PA-C    Allergies    Patient has no known allergies.  Review of Systems   Review of Systems  Constitutional: Negative for chills and fever.  HENT: Positive for hearing loss.   Eyes: Negative for  visual disturbance.  Neurological: Negative for dizziness, facial asymmetry, weakness, light-headedness, numbness and headaches.  All other systems reviewed and are negative.   Physical Exam Updated Vital Signs BP 125/69 (BP Location: Right Arm)   Pulse 73   Temp 98.1 F (36.7 C) (Oral)   Resp 16   LMP 10/31/2019   SpO2 100%   Physical Exam Vitals and nursing note reviewed.  Constitutional:      General: She is not in acute distress.    Appearance: She is well-developed. She is not diaphoretic.  HENT:     Head: Normocephalic and atraumatic.     Left Ear: Tympanic membrane, ear canal and external ear normal. No swelling or tenderness.  No middle ear effusion. There is no impacted cerumen. No mastoid tenderness. Tympanic membrane is not erythematous, retracted or bulging.     Nose: Nose normal. No rhinorrhea.     Mouth/Throat:     Mouth: Mucous membranes are moist.     Pharynx: Oropharynx is clear. No oropharyngeal exudate or posterior oropharyngeal erythema.  Eyes:     General: No scleral icterus.       Right eye: No discharge.        Left eye: No discharge.     Conjunctiva/sclera: Conjunctivae normal.  Cardiovascular:     Rate and Rhythm: Normal rate and regular rhythm.  Pulmonary:     Effort: Pulmonary effort is normal. No respiratory distress.     Breath sounds: No stridor.  Abdominal:     General: There is no distension.  Musculoskeletal:        General: No deformity.     Cervical back: Normal range of motion and neck supple. No rigidity.  Skin:    General: Skin is warm and dry.  Neurological:     Mental Status: She is alert.     Motor: No abnormal muscle tone.     Comments: Facial movements are symmetric.  Speech is not slurred.  Patient is able to hear out of her left ear when she has her hearing aid in which she reports is her normal baseline.  No nystagmus, ataxia or coordination abnormalities.  Psychiatric:        Mood and Affect: Mood normal.         Behavior: Behavior normal.     ED Results / Procedures / Treatments   Labs (all labs ordered are listed, but only abnormal results are displayed) Labs Reviewed - No data to display  EKG None  Radiology No results found.  Procedures Procedures (including critical care time)  Medications Ordered in ED Medications - No data to display  ED Course  I have reviewed the triage vital signs and the nursing notes.  Pertinent labs & imaging results that were available during my care of the patient were reviewed by me and considered in my medical decision making (see chart for details).    MDM Rules/Calculators/A&P                     Patient presents today for evaluation of decreased hearing in her left ear.  She is completely deaf out of the right ear.  Symptoms have been present for multiple days.  She reports that she has had similar symptoms in the past when she has been congested. Recommended humidifier at home as she reports in the past this is worsened with dry air.  Additionally recommended nasal corticosteroid spray, short course of 2 days of Afrin nasal spray with instructions on not to use it longer and a trial of decongestants.  Her physical exam is normal without evidence of otitis media, otitis externa or canal obstruction.  Recommended follow-up with outpatient specialist.  I do not suspect a central cause for her symptoms as she does not have any other acute neurologic findings on exam.  Return precautions were discussed with patient who states their understanding.  At the time of discharge patient denied any unaddressed complaints or concerns.  Patient is agreeable for discharge home.  Note: Portions of this report may have been transcribed using voice recognition software. Every effort was made to ensure accuracy; however, inadvertent computerized transcription errors may be present  Final Clinical Impression(s) / ED Diagnoses Final diagnoses:  Decreased hearing of  left ear    Rx / DC Orders ED Discharge Orders    None       Ollen Gross 12/07/19 2037    Milton Ferguson, MD 12/08/19 0830

## 2019-12-07 NOTE — Discharge Instructions (Addendum)
Please start using a nasal steroid spray such as Flonase or Nasacort.  This can take a few days to fully work therefore I would recommend you use Afrin as directed on the packet.  Do not use Afrin for more than 48 hours as this can actually have a rebound to make you more congested. In addition to this you may try a decongestant such as Sudafed to see if that helps with your symptoms.   Please make sure you are running a humidifier as dry air can worsen your condition.  Please call your hearing specialist.  Sometimes they may be able to put you on a cancellation list.

## 2019-12-10 ENCOUNTER — Ambulatory Visit (INDEPENDENT_AMBULATORY_CARE_PROVIDER_SITE_OTHER): Payer: Self-pay | Admitting: Otolaryngology

## 2019-12-17 ENCOUNTER — Ambulatory Visit (INDEPENDENT_AMBULATORY_CARE_PROVIDER_SITE_OTHER): Payer: Self-pay | Admitting: Otolaryngology

## 2019-12-17 ENCOUNTER — Other Ambulatory Visit: Payer: Self-pay

## 2019-12-17 VITALS — Temp 97.9°F

## 2019-12-17 DIAGNOSIS — J310 Chronic rhinitis: Secondary | ICD-10-CM

## 2019-12-17 DIAGNOSIS — H9120 Sudden idiopathic hearing loss, unspecified ear: Secondary | ICD-10-CM

## 2019-12-17 NOTE — Progress Notes (Signed)
HPI: Stacie [name] is a 39 y.o. female who presents for evaluation of hearing change in her left ear.  She had meningitis as a child and went completely deaf in the right ear.  She has severe hearing loss in the left ear and is worn hearing aids since childhood on the left side only.  2 weeks ago she developed decreased hearing and a static sound in her left ear and sounded distorted.  This developed rather suddenly.  Her hearing is gotten better since that time.  Denies any drainage or pain in the ear.  The tinnitus stopped about 5 days ago in the left side.  Her last hearing test was in 2016.  Past Medical History:  Diagnosis Date  . Deafness in right ear   . DUB (dysfunctional uterine bleeding) 02/07/2012  . Hx of meningitis    No past surgical history on file. Social History   Socioeconomic History  . Marital status: Single    Spouse name: Not on file  . Number of children: Not on file  . Years of education: Not on file  . Highest education level: Not on file  Occupational History  . Not on file  Tobacco Use  . Smoking status: Never Smoker  . Smokeless tobacco: Never Used  Substance and Sexual Activity  . Alcohol use: Yes    Comment: occasionally  . Drug use: No  . Sexual activity: Never    Birth control/protection: None  Other Topics Concern  . Not on file  Social History Narrative  . Not on file   Social Determinants of Health   Financial Resource Strain:   . Difficulty of Paying Living Expenses: Not on file  Food Insecurity:   . Worried About Charity fundraiser in the Last Year: Not on file  . Ran Out of Food in the Last Year: Not on file  Transportation Needs:   . Lack of Transportation (Medical): Not on file  . Lack of Transportation (Non-Medical): Not on file  Physical Activity:   . Days of Exercise per Week: Not on file  . Minutes of Exercise per Session: Not on file  Stress:   . Feeling of Stress : Not on file  Social Connections:   . Frequency of  Communication with Friends and Family: Not on file  . Frequency of Social Gatherings with Friends and Family: Not on file  . Attends Religious Services: Not on file  . Active Member of Clubs or Organizations: Not on file  . Attends Archivist Meetings: Not on file  . Marital Status: Not on file   Family History  Problem Relation Age of Onset  . Ovarian cysts Mother   . Ovarian cysts Sister    No Known Allergies Prior to Admission medications   Medication Sig Start Date End Date Taking? Authorizing Provider  busPIRone (BUSPAR) 7.5 MG tablet Take 1 tablet (7.5 mg total) by mouth 3 (three) times daily. 06/24/14   Harden Mo, MD  cetirizine (ZYRTEC) 10 MG tablet Take 1 tablet (10 mg total) by mouth daily. One tab daily for allergies 07/14/14   Billy Fischer, MD  Chlorpheniramine-PSE-Ibuprofen (ADVIL ALLERGY SINUS) 2-30-200 MG TABS 2 tabs by mouth every 6 hours when necessary 04/29/13   Liam Graham, PA-C  cyclobenzaprine (FLEXERIL) 10 MG tablet Take 1 tablet (10 mg total) by mouth 2 (two) times daily as needed for muscle spasms. 09/20/12   Hess, Hessie Diener, PA-C  fluticasone (FLONASE) 50 MCG/ACT nasal  spray Place 2 sprays into the nose daily. 05/27/13   Luana Shu, Freeman Caldron, PA-C  ibuprofen (ADVIL,MOTRIN) 600 MG tablet Take 1 tablet (600 mg total) by mouth every 6 (six) hours as needed for pain. 09/20/12   Hess, Hessie Diener, PA-C  ipratropium (ATROVENT) 0.06 % nasal spray Place 2 sprays into both nostrils 4 (four) times daily. 07/14/14   Billy Fischer, MD  meclizine (ANTIVERT) 25 MG tablet 1 tablet by mouth 4 times a day when necessary for vertigo 04/29/13   Liam Graham, PA-C  methylPREDNISolone (MEDROL DOSEPAK) 4 MG tablet Use taper dose pack as directed 04/29/13   Liam Graham, PA-C  mometasone (NASONEX) 50 MCG/ACT nasal spray 2 sprays per nostril twice a day 04/29/13   Liam Graham, PA-C  Norethindrone-Ethinyl Estradiol Biphasic (NECON 10/11, 28,) 0.5-35/1-35 MG-MCG tablet Take 1  tablet by mouth daily. 08/16/15   Anyanwu, Sallyanne Havers, MD  PredniSONE 10 MG KIT 12 day taper dose pack, use as directed 05/27/13   Allena Katz H, PA-C     Positive ROS: Otherwise negative no vertigo.  All other systems have been reviewed and were otherwise negative with the exception of those mentioned in the HPI and as above.  Physical Exam: Constitutional: Alert, well-appearing, no acute distress Ears: External ears without lesions or tenderness. Ear canals are clear bilaterally with intact, clear TMs.  She wears a hearing aid in the left ear. Nasal: External nose without lesions. Septum slightly deviated to the right with moderate rhinitis.. Clear nasal passages.  No signs of infection. Oral: Lips and gums without lesions. Tongue and palate mucosa without lesions. Posterior oropharynx clear. Neck: No palpable adenopathy or masses Respiratory: Breathing comfortably  Skin: No facial/neck lesions or rash noted.  Audiogram today demonstrated essentially similar hearing as of previous audiogram performed in 2016 with perhaps a mild to drop in the very high frequencies 3000-4000.  SRT was 55 DB today and 60 DB in 2017.  Type A tympanograms bilaterally.  Procedures  Assessment: On audiologic testing there is no significant change in the hearing. She does have mild rhinitis.  Plan: Recommended use of Nasacort 2 sprays each nostril at night to help with the rhinitis. Did not recommend any further therapy for the ear unless she notices change in her hearing.  She will follow-up as needed.  Radene Journey, MD

## 2019-12-19 ENCOUNTER — Encounter (INDEPENDENT_AMBULATORY_CARE_PROVIDER_SITE_OTHER): Payer: Self-pay

## 2020-11-30 ENCOUNTER — Ambulatory Visit (INDEPENDENT_AMBULATORY_CARE_PROVIDER_SITE_OTHER): Payer: Self-pay | Admitting: Otolaryngology

## 2020-12-07 ENCOUNTER — Ambulatory Visit (INDEPENDENT_AMBULATORY_CARE_PROVIDER_SITE_OTHER): Payer: BC Managed Care – PPO | Admitting: Otolaryngology

## 2020-12-07 ENCOUNTER — Other Ambulatory Visit: Payer: Self-pay

## 2020-12-07 VITALS — Temp 97.3°F

## 2020-12-07 DIAGNOSIS — H90A22 Sensorineural hearing loss, unilateral, left ear, with restricted hearing on the contralateral side: Secondary | ICD-10-CM

## 2020-12-07 NOTE — Progress Notes (Signed)
HPI: Stacie [name] is a 40 y.o. female who returns today for evaluation of hearing.  She apparently had meningitis when she was a child and lost her hearing totally in the right ear and a large amount of hearing loss in the left ear.  Her last hearing test was about a year ago.  She has not really noticed any change in her hearing.  She wears a hearing aid in her left ear.  On previous hearing test a year ago she had [name] hearing in the right ear and approximately 60 dB hearing loss in the left ear.  Audiogram today demonstrated essentially [name] change in hearing compared to previous hearing test 1 year ago with left ear SRT of 65 dB..  Past Medical History:  Diagnosis Date  . Deafness in right ear   . DUB (dysfunctional uterine bleeding) 02/07/2012  . Hx of meningitis    [name] past surgical history on file. Social History   Socioeconomic History  . Marital status: Single    Spouse name: Not on file  . Number of children: Not on file  . Years of education: Not on file  . Highest education level: Not on file  Occupational History  . Not on file  Tobacco Use  . Smoking status: Never Smoker  . Smokeless tobacco: Never Used  Substance and Sexual Activity  . Alcohol use: Yes    Comment: occasionally  . Drug use: [name]  . Sexual activity: Never    Birth control/protection: None  Other Topics Concern  . Not on file  Social History Narrative  . Not on file   Social Determinants of Health   Financial Resource Strain: Not on file  Food Insecurity: Not on file  Transportation Needs: Not on file  Physical Activity: Not on file  Stress: Not on file  Social Connections: Not on file   Family History  Problem Relation Age of Onset  . Ovarian cysts Mother   . Ovarian cysts Sister    [name] Known Allergies Prior to Admission medications   Medication Sig Start Date End Date Taking? Authorizing Provider  busPIRone (BUSPAR) 7.5 MG tablet Take 1 tablet (7.5 mg total) by mouth 3 (three) times daily.  06/24/14   Harden Mo, MD  cetirizine (ZYRTEC) 10 MG tablet Take 1 tablet (10 mg total) by mouth daily. One tab daily for allergies 07/14/14   Billy Fischer, MD  Chlorpheniramine-PSE-Ibuprofen (ADVIL ALLERGY SINUS) 2-30-200 MG TABS 2 tabs by mouth every 6 hours when necessary 04/29/13   Liam Graham, PA-C  cyclobenzaprine (FLEXERIL) 10 MG tablet Take 1 tablet (10 mg total) by mouth 2 (two) times daily as needed for muscle spasms. 09/20/12   Hess, Hessie Diener, PA-C  fluticasone (FLONASE) 50 MCG/ACT nasal spray Place 2 sprays into the nose daily. 05/27/13   Luana Shu, Freeman Caldron, PA-C  ibuprofen (ADVIL,MOTRIN) 600 MG tablet Take 1 tablet (600 mg total) by mouth every 6 (six) hours as needed for pain. 09/20/12   Hess, Hessie Diener, PA-C  ipratropium (ATROVENT) 0.06 % nasal spray Place 2 sprays into both nostrils 4 (four) times daily. 07/14/14   Billy Fischer, MD  meclizine (ANTIVERT) 25 MG tablet 1 tablet by mouth 4 times a day when necessary for vertigo 04/29/13   Liam Graham, PA-C  methylPREDNISolone (MEDROL DOSEPAK) 4 MG tablet Use taper dose pack as directed 04/29/13   Allena Katz H, PA-C  mometasone (NASONEX) 50 MCG/ACT nasal spray 2 sprays per nostril twice  a day 04/29/13   Liam Graham, PA-C  Norethindrone-Ethinyl Estradiol Biphasic (NECON 10/11, 28,) 0.5-35/1-35 MG-MCG tablet Take 1 tablet by mouth daily. 08/16/15   Osborne Oman, MD  PredniSONE 10 MG KIT 12 day taper dose pack, use as directed 05/27/13   Liam Graham, PA-C     Positive ROS: Otherwise negative  All other systems have been reviewed and were otherwise negative with the exception of those mentioned in the HPI and as above.  Physical Exam: Constitutional: Alert, well-appearing, [name] acute distress Ears: External ears without lesions or tenderness. Ear canals are clear bilaterally with intact, clear TMs bilaterally. Nasal: External nose without lesions.. Clear nasal passages Oral: Lips and gums without lesions. Tongue and  palate mucosa without lesions. Posterior oropharynx clear. Neck: [name] palpable adenopathy or masses Respiratory: Breathing comfortably  Skin: [name] facial/neck lesions or rash noted.  Audiogram today demonstrated pure tones between 60 dB and 80 dB with SRT of 65 DB on the left side.  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  She has severe left ear sensorineural hearing loss for which she uses a hearing aid.  This has been stable  Plan: Briefly discussed possible consideration of a cochlear implant and would refer her to [facility] to discuss this.   Radene Journey, MD

## 2020-12-08 ENCOUNTER — Encounter (INDEPENDENT_AMBULATORY_CARE_PROVIDER_SITE_OTHER): Payer: Self-pay

## 2020-12-09 ENCOUNTER — Ambulatory Visit (INDEPENDENT_AMBULATORY_CARE_PROVIDER_SITE_OTHER): Payer: Self-pay | Admitting: Otolaryngology

## 2021-07-20 DIAGNOSIS — Z0000 Encounter for general adult medical examination without abnormal findings: Secondary | ICD-10-CM | POA: Diagnosis not present

## 2021-07-20 DIAGNOSIS — Z23 Encounter for immunization: Secondary | ICD-10-CM | POA: Diagnosis not present

## 2021-07-28 DIAGNOSIS — R5383 Other fatigue: Secondary | ICD-10-CM | POA: Diagnosis not present

## 2021-07-28 DIAGNOSIS — Z124 Encounter for screening for malignant neoplasm of cervix: Secondary | ICD-10-CM | POA: Diagnosis not present

## 2022-06-07 DIAGNOSIS — R5383 Other fatigue: Secondary | ICD-10-CM | POA: Diagnosis not present

## 2022-06-07 DIAGNOSIS — F419 Anxiety disorder, unspecified: Secondary | ICD-10-CM | POA: Diagnosis not present

## 2022-06-07 DIAGNOSIS — R519 Headache, unspecified: Secondary | ICD-10-CM | POA: Diagnosis not present

## 2022-08-02 DIAGNOSIS — D649 Anemia, unspecified: Secondary | ICD-10-CM | POA: Diagnosis not present

## 2022-08-02 DIAGNOSIS — Z13220 Encounter for screening for lipoid disorders: Secondary | ICD-10-CM | POA: Diagnosis not present

## 2022-08-02 DIAGNOSIS — Z0000 Encounter for general adult medical examination without abnormal findings: Secondary | ICD-10-CM | POA: Diagnosis not present

## 2022-08-02 DIAGNOSIS — Z131 Encounter for screening for diabetes mellitus: Secondary | ICD-10-CM | POA: Diagnosis not present

## 2022-08-03 ENCOUNTER — Other Ambulatory Visit: Payer: Self-pay | Admitting: Internal Medicine

## 2022-08-03 DIAGNOSIS — N6320 Unspecified lump in the left breast, unspecified quadrant: Secondary | ICD-10-CM

## 2022-08-03 DIAGNOSIS — N6310 Unspecified lump in the right breast, unspecified quadrant: Secondary | ICD-10-CM

## 2022-09-14 ENCOUNTER — Other Ambulatory Visit: Payer: BC Managed Care – PPO

## 2023-04-11 DIAGNOSIS — N926 Irregular menstruation, unspecified: Secondary | ICD-10-CM | POA: Diagnosis not present

## 2023-04-11 DIAGNOSIS — Z7251 High risk heterosexual behavior: Secondary | ICD-10-CM | POA: Diagnosis not present

## 2023-04-11 DIAGNOSIS — G4719 Other hypersomnia: Secondary | ICD-10-CM | POA: Diagnosis not present

## 2023-04-11 DIAGNOSIS — R430 Anosmia: Secondary | ICD-10-CM | POA: Diagnosis not present

## 2023-04-11 DIAGNOSIS — R5383 Other fatigue: Secondary | ICD-10-CM | POA: Diagnosis not present

## 2023-04-11 DIAGNOSIS — R636 Underweight: Secondary | ICD-10-CM | POA: Diagnosis not present

## 2023-04-11 DIAGNOSIS — H6121 Impacted cerumen, right ear: Secondary | ICD-10-CM | POA: Diagnosis not present

## 2023-04-11 DIAGNOSIS — Z113 Encounter for screening for infections with a predominantly sexual mode of transmission: Secondary | ICD-10-CM | POA: Diagnosis not present

## 2023-04-11 DIAGNOSIS — D649 Anemia, unspecified: Secondary | ICD-10-CM | POA: Diagnosis not present

## 2023-04-11 DIAGNOSIS — Z3202 Encounter for pregnancy test, result negative: Secondary | ICD-10-CM | POA: Diagnosis not present

## 2023-04-11 DIAGNOSIS — R481 Agnosia: Secondary | ICD-10-CM | POA: Diagnosis not present

## 2023-05-07 DIAGNOSIS — G4719 Other hypersomnia: Secondary | ICD-10-CM | POA: Diagnosis not present

## 2023-05-07 DIAGNOSIS — F5104 Psychophysiologic insomnia: Secondary | ICD-10-CM | POA: Diagnosis not present

## 2023-07-25 DIAGNOSIS — R300 Dysuria: Secondary | ICD-10-CM | POA: Diagnosis not present

## 2023-08-18 DIAGNOSIS — Z23 Encounter for immunization: Secondary | ICD-10-CM | POA: Diagnosis not present
